# Patient Record
Sex: Male | Born: 1990 | Race: Black or African American | Hispanic: No | Smoking: Never smoker
Health system: Southern US, Community
[De-identification: ages and names within clinical notes are randomized; demographics above are authoritative.]

## PROBLEM LIST (undated history)

## (undated) DIAGNOSIS — F32A Depression, unspecified: Secondary | ICD-10-CM

## (undated) DIAGNOSIS — R569 Unspecified convulsions: Secondary | ICD-10-CM

## (undated) DIAGNOSIS — F329 Major depressive disorder, single episode, unspecified: Secondary | ICD-10-CM

## (undated) DIAGNOSIS — F419 Anxiety disorder, unspecified: Secondary | ICD-10-CM

## (undated) DIAGNOSIS — F4310 Post-traumatic stress disorder, unspecified: Secondary | ICD-10-CM

## (undated) HISTORY — PX: NOSE SURGERY: SHX723

---

## 2018-07-14 ENCOUNTER — Other Ambulatory Visit: Payer: Self-pay

## 2018-07-14 ENCOUNTER — Inpatient Hospital Stay (HOSPITAL_COMMUNITY)
Admission: EM | Admit: 2018-07-14 | Discharge: 2018-07-20 | DRG: 100 | Disposition: A | Discharge status: Home / Self Care | Payer: No Typology Code available for payment source | Attending: Internal Medicine | Admitting: Internal Medicine

## 2018-07-14 ENCOUNTER — Emergency Department (HOSPITAL_COMMUNITY): Payer: No Typology Code available for payment source

## 2018-07-14 ENCOUNTER — Encounter (HOSPITAL_COMMUNITY): Payer: Self-pay | Admitting: Family Medicine

## 2018-07-14 DIAGNOSIS — J690 Pneumonitis due to inhalation of food and vomit: Secondary | ICD-10-CM

## 2018-07-14 DIAGNOSIS — R7401 Elevation of levels of liver transaminase levels: Secondary | ICD-10-CM | POA: Diagnosis present

## 2018-07-14 DIAGNOSIS — J9601 Acute respiratory failure with hypoxia: Secondary | ICD-10-CM

## 2018-07-14 DIAGNOSIS — R740 Nonspecific elevation of levels of transaminase and lactic acid dehydrogenase [LDH]: Secondary | ICD-10-CM

## 2018-07-14 DIAGNOSIS — N289 Disorder of kidney and ureter, unspecified: Secondary | ICD-10-CM

## 2018-07-14 DIAGNOSIS — G40909 Epilepsy, unspecified, not intractable, without status epilepticus: Secondary | ICD-10-CM | POA: Diagnosis not present

## 2018-07-14 DIAGNOSIS — F418 Other specified anxiety disorders: Secondary | ICD-10-CM

## 2018-07-14 DIAGNOSIS — Z20828 Contact with and (suspected) exposure to other viral communicable diseases: Secondary | ICD-10-CM | POA: Diagnosis present

## 2018-07-14 DIAGNOSIS — E876 Hypokalemia: Secondary | ICD-10-CM

## 2018-07-14 DIAGNOSIS — Z811 Family history of alcohol abuse and dependence: Secondary | ICD-10-CM

## 2018-07-14 DIAGNOSIS — K760 Fatty (change of) liver, not elsewhere classified: Secondary | ICD-10-CM | POA: Diagnosis present

## 2018-07-14 DIAGNOSIS — F4310 Post-traumatic stress disorder, unspecified: Secondary | ICD-10-CM | POA: Diagnosis present

## 2018-07-14 DIAGNOSIS — E872 Acidosis: Secondary | ICD-10-CM | POA: Diagnosis present

## 2018-07-14 DIAGNOSIS — J189 Pneumonia, unspecified organism: Secondary | ICD-10-CM

## 2018-07-14 DIAGNOSIS — F1010 Alcohol abuse, uncomplicated: Secondary | ICD-10-CM

## 2018-07-14 DIAGNOSIS — R569 Unspecified convulsions: Secondary | ICD-10-CM

## 2018-07-14 DIAGNOSIS — R739 Hyperglycemia, unspecified: Secondary | ICD-10-CM | POA: Diagnosis present

## 2018-07-14 DIAGNOSIS — N179 Acute kidney failure, unspecified: Secondary | ICD-10-CM | POA: Diagnosis present

## 2018-07-14 HISTORY — DX: Anxiety disorder, unspecified: F41.9

## 2018-07-14 HISTORY — DX: Major depressive disorder, single episode, unspecified: F32.9

## 2018-07-14 HISTORY — DX: Unspecified convulsions: R56.9

## 2018-07-14 HISTORY — DX: Depression, unspecified: F32.A

## 2018-07-14 LAB — CBC
HCT: 48 % (ref 39.0–52.0)
Hemoglobin: 16.7 g/dL (ref 13.0–17.0)
MCH: 31.5 pg (ref 26.0–34.0)
MCHC: 34.8 g/dL (ref 30.0–36.0)
MCV: 90.6 fL (ref 80.0–100.0)
Platelets: 194 10*3/uL (ref 150–400)
RBC: 5.3 MIL/uL (ref 4.22–5.81)
RDW: 12.7 % (ref 11.5–15.5)
WBC: 11.9 10*3/uL — ABNORMAL HIGH (ref 4.0–10.5)
nRBC: 0 % (ref 0.0–0.2)

## 2018-07-14 LAB — COMPREHENSIVE METABOLIC PANEL
ALT: 598 U/L — ABNORMAL HIGH (ref 0–44)
AST: 432 U/L — ABNORMAL HIGH (ref 15–41)
Albumin: 4.3 g/dL (ref 3.5–5.0)
Alkaline Phosphatase: 49 U/L (ref 38–126)
Anion gap: 22 — ABNORMAL HIGH (ref 5–15)
BUN: 13 mg/dL (ref 6–20)
CO2: 15 mmol/L — ABNORMAL LOW (ref 22–32)
Calcium: 8.4 mg/dL — ABNORMAL LOW (ref 8.9–10.3)
Chloride: 100 mmol/L (ref 98–111)
Creatinine, Ser: 1.46 mg/dL — ABNORMAL HIGH (ref 0.61–1.24)
GFR calc Af Amer: >60 mL/min (ref >60)
GFR calc non Af Amer: >60 mL/min (ref >60)
Glucose, Bld: 218 mg/dL — ABNORMAL HIGH (ref 70–99)
Potassium: 3 mmol/L — ABNORMAL LOW (ref 3.5–5.1)
Sodium: 137 mmol/L (ref 135–145)
Total Bilirubin: 1.3 mg/dL — ABNORMAL HIGH (ref 0.3–1.2)
Total Protein: 7.3 g/dL (ref 6.5–8.1)

## 2018-07-14 LAB — URINALYSIS, ROUTINE W REFLEX MICROSCOPIC
Bilirubin Urine: NEGATIVE
Glucose, UA: 50 mg/dL — AB
Ketones, ur: 20 mg/dL — AB
Leukocytes,Ua: NEGATIVE
Nitrite: NEGATIVE
Protein, ur: 30 mg/dL — AB
Specific Gravity, Urine: 1.017 (ref 1.005–1.030)
pH: 6 (ref 5.0–8.0)

## 2018-07-14 LAB — SAMPLE TO BLOOD BANK

## 2018-07-14 LAB — SARS CORONAVIRUS 2 BY RT PCR (HOSPITAL ORDER, PERFORMED IN ~~LOC~~ HOSPITAL LAB): SARS Coronavirus 2: NEGATIVE

## 2018-07-14 LAB — CDS SEROLOGY

## 2018-07-14 LAB — ETHANOL: Alcohol, Ethyl (B): <10 mg/dL (ref <10)

## 2018-07-14 LAB — PROTIME-INR
INR: 1.1 (ref 0.8–1.2)
Prothrombin Time: 13.7 seconds (ref 11.4–15.2)

## 2018-07-14 LAB — LACTIC ACID, PLASMA: Lactic Acid, Venous: 5.5 mmol/L (ref 0.5–1.9)

## 2018-07-14 MED ORDER — EQUATE NICOTINE 4 MG MT GUM
4.00 | CHEWING_GUM | OROMUCOSAL | Status: DC
Start: ? — End: 2018-07-14

## 2018-07-14 MED ORDER — SODIUM CHLORIDE 0.9 % IV SOLN
3.0000 g | Freq: Once | INTRAVENOUS | Status: AC
  Administered 2018-07-15: 3 g via INTRAVENOUS
  Filled 2018-07-14: qty 3

## 2018-07-14 MED ORDER — LEVETIRACETAM IN NACL 1000 MG/100ML IV SOLN
1000.0000 mg | Freq: Once | INTRAVENOUS | Status: AC
  Administered 2018-07-14: 22:00:00 1000 mg via INTRAVENOUS
  Filled 2018-07-14: qty 100

## 2018-07-14 MED ORDER — CVS EAR DROPS OT
40.00 | OTIC | Status: DC
Start: 2018-07-14 — End: 2018-07-14

## 2018-07-14 MED ORDER — SODIUM CHLORIDE 0.9 % IV BOLUS
1000.0000 mL | Freq: Once | INTRAVENOUS | Status: AC
  Administered 2018-07-14: 22:00:00 1000 mL via INTRAVENOUS

## 2018-07-14 MED ORDER — CHLORDIAZEPOXIDE HCL 25 MG PO CAPS
100.0000 mg | ORAL_CAPSULE | Freq: Once | ORAL | Status: AC
  Administered 2018-07-14: 22:00:00 100 mg via ORAL
  Filled 2018-07-14: qty 4

## 2018-07-14 MED ORDER — SODIUM CHLORIDE 0.9 % IV SOLN
INTRAVENOUS | Status: DC
Start: ? — End: 2018-07-14

## 2018-07-14 MED ORDER — ACETAMINOPHEN 325 MG PO TABS
650.00 | ORAL_TABLET | ORAL | Status: DC
Start: ? — End: 2018-07-14

## 2018-07-14 MED ORDER — IOHEXOL 300 MG/ML  SOLN
100.0000 mL | Freq: Once | INTRAMUSCULAR | Status: AC | PRN
  Administered 2018-07-14: 23:00:00 100 mL via INTRAVENOUS
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
## 2018-07-14 NOTE — ED Notes (Signed)
Urine and culture sent to lab  
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
## 2018-07-15 ENCOUNTER — Encounter (HOSPITAL_COMMUNITY): Payer: Self-pay

## 2018-07-15 DIAGNOSIS — N179 Acute kidney failure, unspecified: Secondary | ICD-10-CM | POA: Diagnosis present

## 2018-07-15 DIAGNOSIS — F418 Other specified anxiety disorders: Secondary | ICD-10-CM | POA: Diagnosis present

## 2018-07-15 DIAGNOSIS — F1010 Alcohol abuse, uncomplicated: Secondary | ICD-10-CM | POA: Diagnosis present

## 2018-07-15 DIAGNOSIS — E876 Hypokalemia: Secondary | ICD-10-CM | POA: Diagnosis present

## 2018-07-15 DIAGNOSIS — G40909 Epilepsy, unspecified, not intractable, without status epilepticus: Secondary | ICD-10-CM | POA: Diagnosis present

## 2018-07-15 DIAGNOSIS — J9601 Acute respiratory failure with hypoxia: Secondary | ICD-10-CM | POA: Diagnosis present

## 2018-07-15 DIAGNOSIS — Z811 Family history of alcohol abuse and dependence: Secondary | ICD-10-CM | POA: Diagnosis not present

## 2018-07-15 DIAGNOSIS — R569 Unspecified convulsions: Secondary | ICD-10-CM | POA: Diagnosis present

## 2018-07-15 DIAGNOSIS — Z20828 Contact with and (suspected) exposure to other viral communicable diseases: Secondary | ICD-10-CM | POA: Diagnosis present

## 2018-07-15 DIAGNOSIS — N289 Disorder of kidney and ureter, unspecified: Secondary | ICD-10-CM

## 2018-07-15 DIAGNOSIS — E872 Acidosis: Secondary | ICD-10-CM | POA: Diagnosis present

## 2018-07-15 DIAGNOSIS — K760 Fatty (change of) liver, not elsewhere classified: Secondary | ICD-10-CM | POA: Diagnosis present

## 2018-07-15 DIAGNOSIS — R739 Hyperglycemia, unspecified: Secondary | ICD-10-CM | POA: Diagnosis present

## 2018-07-15 DIAGNOSIS — F4310 Post-traumatic stress disorder, unspecified: Secondary | ICD-10-CM | POA: Diagnosis present

## 2018-07-15 DIAGNOSIS — J690 Pneumonitis due to inhalation of food and vomit: Secondary | ICD-10-CM | POA: Diagnosis present

## 2018-07-15 LAB — CBC WITH DIFFERENTIAL/PLATELET
Abs Immature Granulocytes: 0.04 10*3/uL (ref 0.00–0.07)
Basophils Absolute: 0 10*3/uL (ref 0.0–0.1)
Basophils Relative: 0 %
Eosinophils Absolute: 0 10*3/uL (ref 0.0–0.5)
Eosinophils Relative: 0 %
HCT: 41.8 % (ref 39.0–52.0)
Hemoglobin: 14.7 g/dL (ref 13.0–17.0)
Immature Granulocytes: 0 %
Lymphocytes Relative: 5 %
Lymphs Abs: 0.7 10*3/uL (ref 0.7–4.0)
MCH: 31.6 pg (ref 26.0–34.0)
MCHC: 35.2 g/dL (ref 30.0–36.0)
MCV: 89.9 fL (ref 80.0–100.0)
Monocytes Absolute: 0.5 10*3/uL (ref 0.1–1.0)
Monocytes Relative: 3 %
Neutro Abs: 12.3 10*3/uL — ABNORMAL HIGH (ref 1.7–7.7)
Neutrophils Relative %: 92 %
Platelets: 142 10*3/uL — ABNORMAL LOW (ref 150–400)
RBC: 4.65 MIL/uL (ref 4.22–5.81)
RDW: 12.6 % (ref 11.5–15.5)
WBC: 13.5 10*3/uL — ABNORMAL HIGH (ref 4.0–10.5)
nRBC: 0 % (ref 0.0–0.2)

## 2018-07-15 LAB — GLUCOSE, CAPILLARY
Glucose-Capillary: 89 mg/dL (ref 70–99)
Glucose-Capillary: 80 mg/dL (ref 70–99)
Glucose-Capillary: 82 mg/dL (ref 70–99)

## 2018-07-15 LAB — LACTIC ACID, PLASMA: Lactic Acid, Venous: 1.2 mmol/L (ref 0.5–1.9)

## 2018-07-15 LAB — CREATININE, URINE, RANDOM: Creatinine, Urine: 67.12 mg/dL

## 2018-07-15 LAB — DIFFERENTIAL
Abs Immature Granulocytes: 0.03 10*3/uL (ref 0.00–0.07)
Basophils Absolute: 0 10*3/uL (ref 0.0–0.1)
Basophils Relative: 0 %
Eosinophils Absolute: 0 10*3/uL (ref 0.0–0.5)
Eosinophils Relative: 0 %
Immature Granulocytes: 0 %
Lymphocytes Relative: 3 %
Lymphs Abs: 0.4 10*3/uL — ABNORMAL LOW (ref 0.7–4.0)
Monocytes Absolute: 0.3 10*3/uL (ref 0.1–1.0)
Monocytes Relative: 3 %
Neutro Abs: 10.3 10*3/uL — ABNORMAL HIGH (ref 1.7–7.7)
Neutrophils Relative %: 94 %

## 2018-07-15 LAB — CBG MONITORING, ED
Glucose-Capillary: 88 mg/dL (ref 70–99)
Glucose-Capillary: 85 mg/dL (ref 70–99)
Glucose-Capillary: 112 mg/dL — ABNORMAL HIGH (ref 70–99)

## 2018-07-15 LAB — COMPREHENSIVE METABOLIC PANEL
ALT: 417 U/L — ABNORMAL HIGH (ref 0–44)
AST: 315 U/L — ABNORMAL HIGH (ref 15–41)
Albumin: 3.4 g/dL — ABNORMAL LOW (ref 3.5–5.0)
Alkaline Phosphatase: 33 U/L — ABNORMAL LOW (ref 38–126)
Anion gap: 8 (ref 5–15)
BUN: 8 mg/dL (ref 6–20)
CO2: 22 mmol/L (ref 22–32)
Calcium: 7.4 mg/dL — ABNORMAL LOW (ref 8.9–10.3)
Chloride: 106 mmol/L (ref 98–111)
Creatinine, Ser: 0.97 mg/dL (ref 0.61–1.24)
GFR calc Af Amer: >60 mL/min (ref >60)
GFR calc non Af Amer: >60 mL/min (ref >60)
Glucose, Bld: 99 mg/dL (ref 70–99)
Potassium: 4.1 mmol/L (ref 3.5–5.1)
Sodium: 136 mmol/L (ref 135–145)
Total Bilirubin: 1.4 mg/dL — ABNORMAL HIGH (ref 0.3–1.2)
Total Protein: 5.8 g/dL — ABNORMAL LOW (ref 6.5–8.1)

## 2018-07-15 LAB — HEMOGLOBIN A1C
Hgb A1c MFr Bld: 5 % (ref 4.8–5.6)
Mean Plasma Glucose: 96.8 mg/dL

## 2018-07-15 LAB — STREP PNEUMONIAE URINARY ANTIGEN: Strep Pneumo Urinary Antigen: NEGATIVE

## 2018-07-15 LAB — RAPID URINE DRUG SCREEN, HOSP PERFORMED
Amphetamines: NOT DETECTED
Barbiturates: NOT DETECTED
Benzodiazepines: POSITIVE — AB
Cocaine: NOT DETECTED
Opiates: NOT DETECTED
Tetrahydrocannabinol: NOT DETECTED

## 2018-07-15 LAB — PROCALCITONIN
Procalcitonin: 4.02 ng/mL
Procalcitonin: 5.65 ng/mL

## 2018-07-15 LAB — HIV ANTIBODY (ROUTINE TESTING W REFLEX): HIV Screen 4th Generation wRfx: NONREACTIVE

## 2018-07-15 LAB — SALICYLATE LEVEL: Salicylate Lvl: <7 mg/dL (ref 2.8–30.0)

## 2018-07-15 LAB — SODIUM, URINE, RANDOM: Sodium, Ur: 100 mmol/L

## 2018-07-15 LAB — ACETAMINOPHEN LEVEL: Acetaminophen (Tylenol), Serum: <10 ug/mL — ABNORMAL LOW (ref 10–30)

## 2018-07-15 LAB — MAGNESIUM: Magnesium: 1.8 mg/dL (ref 1.7–2.4)

## 2018-07-15 LAB — MRSA PCR SCREENING: MRSA by PCR: NEGATIVE

## 2018-07-15 MED ORDER — ENOXAPARIN SODIUM 40 MG/0.4ML ~~LOC~~ SOLN
40.0000 mg | SUBCUTANEOUS | Status: DC
  Administered 2018-07-15 – 2018-07-19 (×5): 40 mg via SUBCUTANEOUS
  Filled 2018-07-15 (×5): qty 0.4

## 2018-07-15 MED ORDER — VITAMIN B-1 100 MG PO TABS
100.0000 mg | ORAL_TABLET | Freq: Every day | ORAL | Status: DC
  Administered 2018-07-16 – 2018-07-20 (×5): 100 mg via ORAL
  Filled 2018-07-15 (×5): qty 1

## 2018-07-15 MED ORDER — FOLIC ACID 1 MG PO TABS
1.0000 mg | ORAL_TABLET | Freq: Every day | ORAL | Status: DC
  Administered 2018-07-16 – 2018-07-20 (×5): 1 mg via ORAL
  Filled 2018-07-15 (×5): qty 1

## 2018-07-15 MED ORDER — ACETAMINOPHEN 325 MG PO TABS
650.0000 mg | ORAL_TABLET | Freq: Four times a day (QID) | ORAL | Status: DC | PRN
  Administered 2018-07-15: 10:00:00 650 mg via ORAL
  Filled 2018-07-15: qty 2

## 2018-07-15 MED ORDER — POLYETHYLENE GLYCOL 3350 17 G PO PACK: 17.0000 g | PACK | Freq: Every day | ORAL | Status: DC | PRN

## 2018-07-15 MED ORDER — ADULT MULTIVITAMIN W/MINERALS CH
1.0000 | ORAL_TABLET | Freq: Every day | ORAL | Status: DC
  Administered 2018-07-15 – 2018-07-20 (×6): 1 via ORAL
  Filled 2018-07-15 (×6): qty 1

## 2018-07-15 MED ORDER — HYDROCODONE-ACETAMINOPHEN 5-325 MG PO TABS
1.0000 | ORAL_TABLET | ORAL | Status: DC | PRN
  Administered 2018-07-15 – 2018-07-20 (×3): 1 via ORAL
  Filled 2018-07-15 (×3): qty 1

## 2018-07-15 MED ORDER — SODIUM CHLORIDE 0.9 % IV SOLN
3.0000 g | Freq: Four times a day (QID) | INTRAVENOUS | Status: DC
  Administered 2018-07-15 – 2018-07-17 (×8): 3 g via INTRAVENOUS
  Filled 2018-07-15 (×12): qty 3

## 2018-07-15 MED ORDER — FOLIC ACID 5 MG/ML IJ SOLN
1.0000 mg | Freq: Every day | INTRAMUSCULAR | Status: DC
  Administered 2018-07-15: 1 mg via INTRAVENOUS
  Filled 2018-07-15: qty 0.2

## 2018-07-15 MED ORDER — INSULIN ASPART 100 UNIT/ML ~~LOC~~ SOLN: 0.0000 [IU] | Freq: Three times a day (TID) | SUBCUTANEOUS | Status: DC

## 2018-07-15 MED ORDER — LORAZEPAM 2 MG/ML IJ SOLN
2.0000 mg | INTRAMUSCULAR | Status: DC | PRN
  Administered 2018-07-17 – 2018-07-19 (×7): 2 mg via INTRAVENOUS
  Filled 2018-07-15 (×2): qty 1
  Filled 2018-07-15: qty 2
  Filled 2018-07-15: qty 1
  Filled 2018-07-15: qty 2
  Filled 2018-07-15 (×2): qty 1
  Filled 2018-07-15: qty 2
  Filled 2018-07-15: qty 1

## 2018-07-15 MED ORDER — INSULIN ASPART 100 UNIT/ML ~~LOC~~ SOLN: 0.0000 [IU] | Freq: Every day | SUBCUTANEOUS | Status: DC

## 2018-07-15 MED ORDER — SODIUM CHLORIDE 0.9 % IV SOLN
INTRAVENOUS | Status: DC
  Administered 2018-07-15 – 2018-07-17 (×5): via INTRAVENOUS

## 2018-07-15 MED ORDER — ENOXAPARIN SODIUM 40 MG/0.4ML ~~LOC~~ SOLN: 40.0000 mg | SUBCUTANEOUS | Status: DC

## 2018-07-15 MED ORDER — ACETAMINOPHEN 650 MG RE SUPP: 650.0000 mg | Freq: Four times a day (QID) | RECTAL | Status: DC | PRN

## 2018-07-15 MED ORDER — LEVETIRACETAM 500 MG PO TABS
500.0000 mg | ORAL_TABLET | Freq: Two times a day (BID) | ORAL | Status: DC
  Administered 2018-07-15 – 2018-07-20 (×11): 500 mg via ORAL
  Filled 2018-07-15 (×11): qty 1

## 2018-07-15 MED ORDER — POTASSIUM CHLORIDE CRYS ER 20 MEQ PO TBCR
20.0000 meq | EXTENDED_RELEASE_TABLET | Freq: Once | ORAL | Status: AC
  Administered 2018-07-15: 20 meq via ORAL
  Filled 2018-07-15: qty 1

## 2018-07-15 MED ORDER — MAGIC MOUTHWASH W/LIDOCAINE
15.0000 mL | Freq: Four times a day (QID) | ORAL | Status: DC | PRN
  Administered 2018-07-15 – 2018-07-16 (×2): 15 mL via ORAL
  Filled 2018-07-15 (×5): qty 15

## 2018-07-15 MED ORDER — POTASSIUM CHLORIDE IN NACL 20-0.9 MEQ/L-% IV SOLN
INTRAVENOUS | Status: AC
  Administered 2018-07-15 (×2): via INTRAVENOUS
  Filled 2018-07-15 (×2): qty 1000

## 2018-07-15 MED ORDER — SODIUM CHLORIDE 0.9% FLUSH
3.0000 mL | Freq: Two times a day (BID) | INTRAVENOUS | Status: DC
  Administered 2018-07-15 – 2018-07-19 (×8): 3 mL via INTRAVENOUS

## 2018-07-15 MED ORDER — ONDANSETRON HCL 4 MG/2ML IJ SOLN
4.0000 mg | Freq: Four times a day (QID) | INTRAMUSCULAR | Status: DC | PRN
  Administered 2018-07-16 – 2018-07-17 (×2): 4 mg via INTRAVENOUS
  Filled 2018-07-15 (×2): qty 2

## 2018-07-15 MED ORDER — ONDANSETRON HCL 4 MG PO TABS: 4.0000 mg | ORAL_TABLET | Freq: Four times a day (QID) | ORAL | Status: DC | PRN

## 2018-07-15 MED ORDER — THIAMINE HCL 100 MG/ML IJ SOLN
100.0000 mg | Freq: Every day | INTRAMUSCULAR | Status: DC
  Administered 2018-07-15: 10:00:00 100 mg via INTRAVENOUS
  Filled 2018-07-15: qty 2

## 2018-07-15 NOTE — ED Notes (Signed)
Patient complaining of 8/10 aching chest pain. Pain is made worse with movement and to palpation. EKG completed and ST noted on monitor. VSS.  MD Dayna Barker text paged regarding patient chest pain. MD to see patient shortly. PRN Tylenol ordered for chest pain, possibly secondary to CPR on patient yesterday.

## 2018-07-15 NOTE — Progress Notes (Signed)
Chart reviewed and scheduled admission, defer to IP [facility] CM/CSW to assist with dc needs. Isidoro Donning RN CCM Case Mgmt phone [phone]
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
## 2018-07-15 NOTE — ED Notes (Signed)
Bed: WA16 Expected date:  Expected time:  Means of arrival:  Comments: Res A 

## 2018-07-15 NOTE — ED Notes (Signed)
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 hematoma. Adrenals/Urinary Tract: No adrenal hemorrhage or renal injury identified. Bladder is unremarkable. Stomach/Bowel: Moderate  fluid within the stomach. Stomach, large and small bowel grossly unremarkable. Appendix is normal. Vascular/Lymphatic: No evidence of aneurysm or adenopathy. Reproductive: No visible focal abnormality. Other: No free fluid or free air. Musculoskeletal: No acute bony abnormality. IMPRESSION: Extensive airspace opacities dependently in both lower lobes and the posterior right upper lobe. Given a fluid-filled esophagus, this could reflect aspiration. Fatty infiltration of the liver. No evidence of solid organ injury. Electronically Signed   By: Charlett Nose M.D.   On: 07/14/2018 23:02   Dg Chest Port 1 View  Result Date: 07/14/2018 CLINICAL DATA:  Hypoxia EXAM: PORTABLE CHEST 1 VIEW COMPARISON:  None. FINDINGS: The heart size and mediastinal contours are within normal limits. Both lungs are clear. The visualized skeletal structures are unremarkable. IMPRESSION: No active disease. Electronically Signed   By: Alcide Clever M.D.   On: 07/14/2018 21:12    Pending Labs Unresulted Labs (From admission, onward)    Start     Ordered   07/15/18 0500  HIV antibody (Routine Testing)  Tomorrow morning,   R     07/15/18 0000   07/15/18 0500  Procalcitonin  Daily,   R     07/15/18 0000   07/15/18 0500  Hepatitis panel, acute  Tomorrow morning,   R     07/15/18 0019   07/14/18 2357  Culture, sputum-assessment  Once,   R     07/15/18 0000   07/14/18 2357  Gram stain  Once,   R     07/15/18 0000          Vitals/Pain Today's Vitals   07/15/18 1000 07/15/18 1112 07/15/18 1200 07/15/18 1300  BP: (!) 120/54 102/62 115/65 126/67  Pulse: 100 99 94 95  Resp: 19 16 18  (!) 29  Temp:      TempSrc:      SpO2: 97% 97% 97% 99%  PainSc:        Isolation Precautions No active isolations  Medications Medications  sodium chloride flush (NS) 0.9 % injection 3 mL (3 mLs Intravenous Given 07/15/18 0933)  0.9 % NaCl with KCl 20 mEq/  L  infusion ( Intravenous Stopped 07/15/18 1242)  acetaminophen (TYLENOL) tablet 650 mg  (650 mg Oral Given 07/15/18 0932)    Or  acetaminophen (TYLENOL) suppository 650 mg ( Rectal See Alternative 07/15/18 0932)  HYDROcodone-acetaminophen (NORCO/VICODIN) 5-325 MG per tablet 1 tablet (has no administration in time range)  polyethylene glycol (MIRALAX / GLYCOLAX) packet 17 g (has no administration in time range)  ondansetron (ZOFRAN) tablet 4 mg (has no administration in time range)    Or  ondansetron (ZOFRAN) injection 4 mg (has no administration in time range)  LORazepam (ATIVAN) injection 2-3 mg (has no administration in time range)  multivitamin with minerals tablet 1 tablet (1 tablet Oral Given 07/15/18 0932)  levETIRAcetam (KEPPRA) tablet 500 mg (500 mg Oral Given 07/15/18 0932)  Ampicillin-Sulbactam (UNASYN) 3 g in sodium chloride 0.9 % 100 mL IVPB (0 g Intravenous Stopped 07/15/18 0724)  insulin aspart (novoLOG) injection 0-9 Units (0 Units Subcutaneous Not Given 07/15/18 1252)  insulin aspart (novoLOG) injection 0-5 Units (0 Units Subcutaneous Not Given 07/15/18 0116)  thiamine (VITAMIN B-1) tablet 100 mg (has no administration in time range)  folic acid (FOLVITE) tablet 1 mg (has no administration in time range)  sodium chloride 0.9 % bolus 1,000 mL (0 mLs Intravenous Stopped 07/14/18 2303)  sodium chloride 0.9 % bolus 1,000 mL (0 mLs Intravenous Stopped 07/14/18 2303)  levETIRAcetam (KEPPRA) IVPB 1000 mg/100 mL premix (0 mg Intravenous Stopped 07/14/18 2303)  chlordiazePOXIDE (LIBRIUM) capsule 100 mg (100 mg Oral Given 07/14/18 2222)  iohexol (OMNIPAQUE) 300 MG/ML solution 100 mL (100 mLs Intravenous Contrast Given 07/14/18 2237)  Ampicillin-Sulbactam (UNASYN) 3 g in sodium chloride 0.9 % 100 mL IVPB (0 g Intravenous Stopped 07/15/18 0054)  potassium chloride SA (K-DUR) CR tablet 20 mEq (20 mEq Oral Given 07/15/18 0027)    Mobility walks with person assist

## 2018-07-15 NOTE — ED Notes (Signed)
Seizure pads were placed on pt. Bed due to pt. Having a Hx of seizures. Nurse aware.

## 2018-07-15 NOTE — Progress Notes (Signed)
Pharmacy Antibiotic Note  Jim Duke is a 28 y.o. male admitted on 07/14/2018 with bacteremia.  Pharmacy has been consulted for Unasyn dosing.  Plan: Unasyn 3gm iv q6hr     Temp (24hrs), Avg:97.9 F (36.6 C), Min:97.9 F (36.6 C), Max:97.9 F (36.6 C)  Recent Labs  Lab 07/14/18 2102  WBC 11.9*  CREATININE 1.46*  LATICACIDVEN 5.5*    CrCl cannot be calculated (Unknown ideal weight.).    No Known Allergies  Antimicrobials this admission: Unasyn 07/15/2018 >>  Dose adjustments this admission: -  Microbiology results: -  Thank you for allowing pharmacy to be a part of this patient's care.  Aleene Davidson Crowford 07/15/2018 12:17 AM

## 2018-07-16 LAB — BASIC METABOLIC PANEL
Anion gap: 9 (ref 5–15)
BUN: 6 mg/dL (ref 6–20)
CO2: 23 mmol/L (ref 22–32)
Calcium: 8.2 mg/dL — ABNORMAL LOW (ref 8.9–10.3)
Chloride: 106 mmol/L (ref 98–111)
Creatinine, Ser: 0.83 mg/dL (ref 0.61–1.24)
GFR calc Af Amer: >60 mL/min (ref >60)
GFR calc non Af Amer: >60 mL/min (ref >60)
Glucose, Bld: 85 mg/dL (ref 70–99)
Potassium: 3.4 mmol/L — ABNORMAL LOW (ref 3.5–5.1)
Sodium: 138 mmol/L (ref 135–145)

## 2018-07-16 LAB — CBC
HCT: 42.9 % (ref 39.0–52.0)
Hemoglobin: 14.6 g/dL (ref 13.0–17.0)
MCH: 31.3 pg (ref 26.0–34.0)
MCHC: 34 g/dL (ref 30.0–36.0)
MCV: 91.9 fL (ref 80.0–100.0)
Platelets: 124 10*3/uL — ABNORMAL LOW (ref 150–400)
RBC: 4.67 MIL/uL (ref 4.22–5.81)
RDW: 13.1 % (ref 11.5–15.5)
WBC: 12.4 10*3/uL — ABNORMAL HIGH (ref 4.0–10.5)
nRBC: 0 % (ref 0.0–0.2)

## 2018-07-16 LAB — PROCALCITONIN: Procalcitonin: 4.31 ng/mL

## 2018-07-16 LAB — GLUCOSE, CAPILLARY
Glucose-Capillary: 67 mg/dL — ABNORMAL LOW (ref 70–99)
Glucose-Capillary: 90 mg/dL (ref 70–99)
Glucose-Capillary: 81 mg/dL (ref 70–99)
Glucose-Capillary: 79 mg/dL (ref 70–99)
Glucose-Capillary: 81 mg/dL (ref 70–99)

## 2018-07-16 LAB — HEPATITIS PANEL, ACUTE
HCV Ab: 0.3 s/co ratio (ref 0.0–0.9)
Hep A IgM: NEGATIVE
Hep B C IgM: NEGATIVE
Hepatitis B Surface Ag: NEGATIVE

## 2018-07-16 MED ORDER — ALUM & MAG HYDROXIDE-SIMETH 200-200-20 MG/5ML PO SUSP: 30.0000 mL | ORAL | Status: DC | PRN

## 2018-07-16 MED ORDER — POTASSIUM CHLORIDE CRYS ER 20 MEQ PO TBCR
20.0000 meq | EXTENDED_RELEASE_TABLET | Freq: Once | ORAL | Status: AC
  Administered 2018-07-16: 09:00:00 20 meq via ORAL
  Filled 2018-07-16: qty 1

## 2018-07-16 MED ORDER — IBUPROFEN 200 MG PO TABS: 400.0000 mg | ORAL_TABLET | Freq: Four times a day (QID) | ORAL | Status: DC | PRN

## 2018-07-16 MED ORDER — PANTOPRAZOLE SODIUM 40 MG PO TBEC
40.0000 mg | DELAYED_RELEASE_TABLET | Freq: Every day | ORAL | Status: DC
  Administered 2018-07-16 – 2018-07-20 (×5): 40 mg via ORAL
  Filled 2018-07-16 (×5): qty 1
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
## 2018-07-16 NOTE — Progress Notes (Signed)
Called report to [facility] on 4E. Informed patient on transfer and new room number. Patient stated he would update family. All belongings sent with patient.

## 2018-07-16 NOTE — Progress Notes (Signed)
Pt called and is complaining of increased SOB and a pressure in his chest. 12 lead EKG obtained and Dr. Dayna Barker made aware. Will continue to monitor.

## 2018-07-17 ENCOUNTER — Inpatient Hospital Stay (HOSPITAL_COMMUNITY): Payer: No Typology Code available for payment source

## 2018-07-17 LAB — CBC
HCT: 42.7 % (ref 39.0–52.0)
Hemoglobin: 14.7 g/dL (ref 13.0–17.0)
MCH: 30.9 pg (ref 26.0–34.0)
MCHC: 34.4 g/dL (ref 30.0–36.0)
MCV: 89.9 fL (ref 80.0–100.0)
Platelets: 155 10*3/uL (ref 150–400)
RBC: 4.75 MIL/uL (ref 4.22–5.81)
RDW: 12.4 % (ref 11.5–15.5)
WBC: 8.7 10*3/uL (ref 4.0–10.5)
nRBC: 0 % (ref 0.0–0.2)

## 2018-07-17 LAB — COMPREHENSIVE METABOLIC PANEL
ALT: 174 U/L — ABNORMAL HIGH (ref 0–44)
AST: 45 U/L — ABNORMAL HIGH (ref 15–41)
Albumin: 3.3 g/dL — ABNORMAL LOW (ref 3.5–5.0)
Alkaline Phosphatase: 41 U/L (ref 38–126)
Anion gap: 14 (ref 5–15)
BUN: 6 mg/dL (ref 6–20)
CO2: 21 mmol/L — ABNORMAL LOW (ref 22–32)
Calcium: 8.5 mg/dL — ABNORMAL LOW (ref 8.9–10.3)
Chloride: 105 mmol/L (ref 98–111)
Creatinine, Ser: 0.9 mg/dL (ref 0.61–1.24)
GFR calc Af Amer: >60 mL/min (ref >60)
GFR calc non Af Amer: >60 mL/min (ref >60)
Glucose, Bld: 79 mg/dL (ref 70–99)
Potassium: 3.7 mmol/L (ref 3.5–5.1)
Sodium: 140 mmol/L (ref 135–145)
Total Bilirubin: 1.5 mg/dL — ABNORMAL HIGH (ref 0.3–1.2)
Total Protein: 6.4 g/dL — ABNORMAL LOW (ref 6.5–8.1)

## 2018-07-17 LAB — GLUCOSE, CAPILLARY
Glucose-Capillary: 76 mg/dL (ref 70–99)
Glucose-Capillary: 78 mg/dL (ref 70–99)
Glucose-Capillary: 79 mg/dL (ref 70–99)
Glucose-Capillary: 78 mg/dL (ref 70–99)

## 2018-07-17 MED ORDER — PIPERACILLIN-TAZOBACTAM 3.375 G IVPB
3.3750 g | Freq: Three times a day (TID) | INTRAVENOUS | Status: DC
  Administered 2018-07-17 – 2018-07-20 (×9): 3.375 g via INTRAVENOUS
  Filled 2018-07-17 (×9): qty 50

## 2018-07-17 MED ORDER — AMOXICILLIN-POT CLAVULANATE 875-125 MG PO TABS: 1.0000 | ORAL_TABLET | Freq: Two times a day (BID) | ORAL | 0 refills | Status: DC

## 2018-07-17 MED ORDER — LEVETIRACETAM 500 MG PO TABS: 500.0000 mg | ORAL_TABLET | Freq: Two times a day (BID) | ORAL | 0 refills | Status: DC

## 2018-07-17 NOTE — Progress Notes (Signed)
SATURATION QUALIFICATIONS: (This note is used to comply with regulatory documentation for home oxygen)  Patient Saturations on Room Air at Rest = 97%  Patient Saturations on Room Air while Ambulating = 94%  Patient Saturations on 0 Liters of oxygen while Ambulating = 94%  Please briefly explain why patient needs home oxygen: 
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
## 2018-07-17 NOTE — Progress Notes (Signed)
NT attempted to ambulate patient and obtain O2 saturation. Pt stood at bedside with an O2 saturation of 95% on room air. Shortly after standing the pt verbalized they were nauseous and light-headed and had to sit back down, pt then vomited 100 mL of clear emesis. Pt then received Zofran and no further nausea has been reported. Will try to ambulate pt again before end of shift.

## 2018-07-17 NOTE — Progress Notes (Signed)
Pharmacy Antibiotic Note  Jim Duke is a 28 y.o. male admitted on 07/14/2018 with aspiration PNA.  Pharmacy has been consulted for piperacillin/tazobactam dosing. NKDA.  Patient currently on D #3 IV antibiotics on ampicillin/sulbactam for aspiration PNA. MD would like to broaden coverage to piperacillin/tazobactam - pharmacy to dose.   Today, 07/17/18  WBC 8.7 - WNL  SCr 0.9, CrCl >100 mL/min  Afebrile  Plan:  Discontinue ampicillin/sulbactam  Initiate piperacillin/tazobactam 3.375 g IV q8h EI  Height: 5\' 8"  (172.7 cm) Weight: 197 lb 12 oz (89.7 kg) IBW/kg (Calculated) : 68.4  Temp (24hrs), Avg:98.7 F (37.1 C), Min:98.3 F (36.8 C), Max:99.1 F (37.3 C)  Recent Labs  Lab 07/14/18 2102 07/15/18 0002 07/15/18 0434 07/16/18 0242 07/17/18 0530  WBC 11.9*  --  13.5* 12.4* 8.7  CREATININE 1.46*  --  0.97 0.83 0.90  LATICACIDVEN 5.5* 1.2  --   --   --     Estimated Creatinine Clearance: 130.5 mL/min (by C-G formula based on SCr of 0.9 mg/dL).    No Known Allergies  Antimicrobials this admission: Piperacillin/tazobactam 5/9 >>  Ampicillin/sulbactam 5/7 >> 5/6  Dose adjustments this admission:  Microbiology results: 5/7 Sputum: Sent  5/7 MRSA PCR: Negative 5/9 COVID-19: Negative  Thank you for allowing pharmacy to be a part of this patient's care.  Cindi Carbon, PharmD 07/17/2018 12:00 PM

## 2018-07-18 LAB — COMPREHENSIVE METABOLIC PANEL
ALT: 128 U/L — ABNORMAL HIGH (ref 0–44)
AST: 33 U/L (ref 15–41)
Albumin: 3.5 g/dL (ref 3.5–5.0)
Alkaline Phosphatase: 42 U/L (ref 38–126)
Anion gap: 11 (ref 5–15)
BUN: 7 mg/dL (ref 6–20)
CO2: 23 mmol/L (ref 22–32)
Calcium: 8.6 mg/dL — ABNORMAL LOW (ref 8.9–10.3)
Chloride: 106 mmol/L (ref 98–111)
Creatinine, Ser: 0.98 mg/dL (ref 0.61–1.24)
GFR calc Af Amer: >60 mL/min (ref >60)
GFR calc non Af Amer: >60 mL/min (ref >60)
Glucose, Bld: 86 mg/dL (ref 70–99)
Potassium: 3.4 mmol/L — ABNORMAL LOW (ref 3.5–5.1)
Sodium: 140 mmol/L (ref 135–145)
Total Bilirubin: 1.4 mg/dL — ABNORMAL HIGH (ref 0.3–1.2)
Total Protein: 6.5 g/dL (ref 6.5–8.1)

## 2018-07-18 LAB — CBC
HCT: 43.8 % (ref 39.0–52.0)
Hemoglobin: 15.3 g/dL (ref 13.0–17.0)
MCH: 31.2 pg (ref 26.0–34.0)
MCHC: 34.9 g/dL (ref 30.0–36.0)
MCV: 89.4 fL (ref 80.0–100.0)
Platelets: 185 10*3/uL (ref 150–400)
RBC: 4.9 MIL/uL (ref 4.22–5.81)
RDW: 12.1 % (ref 11.5–15.5)
WBC: 7.6 10*3/uL (ref 4.0–10.5)
nRBC: 0 % (ref 0.0–0.2)

## 2018-07-18 LAB — GLUCOSE, CAPILLARY
Glucose-Capillary: 93 mg/dL (ref 70–99)
Glucose-Capillary: 107 mg/dL — ABNORMAL HIGH (ref 70–99)
Glucose-Capillary: 88 mg/dL (ref 70–99)
Glucose-Capillary: 84 mg/dL (ref 70–99)

## 2018-07-18 MED ORDER — SODIUM CHLORIDE 0.9 % IV SOLN
INTRAVENOUS | Status: DC | PRN
  Administered 2018-07-18: 13:00:00 250 mL via INTRAVENOUS

## 2018-07-18 MED ORDER — POTASSIUM CHLORIDE CRYS ER 20 MEQ PO TBCR
20.0000 meq | EXTENDED_RELEASE_TABLET | Freq: Once | ORAL | Status: AC
  Administered 2018-07-18: 20 meq via ORAL
  Filled 2018-07-18: qty 1

## 2018-07-18 MED ORDER — LORAZEPAM 2 MG/ML IJ SOLN
1.0000 mg | Freq: Once | INTRAMUSCULAR | Status: AC
  Administered 2018-07-18: 02:00:00 1 mg via INTRAVENOUS
  Filled 2018-07-18: qty 1
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
## 2018-07-19 ENCOUNTER — Inpatient Hospital Stay (HOSPITAL_COMMUNITY): Payer: No Typology Code available for payment source

## 2018-07-19 LAB — GLUCOSE, CAPILLARY
Glucose-Capillary: 81 mg/dL (ref 70–99)
Glucose-Capillary: 79 mg/dL (ref 70–99)
Glucose-Capillary: 81 mg/dL (ref 70–99)
Glucose-Capillary: 74 mg/dL (ref 70–99)

## 2018-07-19 MED ORDER — IBUPROFEN 200 MG PO TABS: 400.0000 mg | ORAL_TABLET | Freq: Four times a day (QID) | ORAL | Status: DC | PRN

## 2018-07-19 MED ORDER — ZOLPIDEM TARTRATE 10 MG PO TABS
10.0000 mg | ORAL_TABLET | Freq: Every evening | ORAL | Status: DC | PRN
  Administered 2018-07-19: 10 mg via ORAL
  Filled 2018-07-19: qty 1
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
## 2018-07-20 LAB — GLUCOSE, CAPILLARY: Glucose-Capillary: 102 mg/dL — ABNORMAL HIGH (ref 70–99)

## 2018-07-20 MED ORDER — AMOXICILLIN-POT CLAVULANATE 875-125 MG PO TABS: 1.0000 | ORAL_TABLET | Freq: Two times a day (BID) | ORAL | 0 refills | Status: AC

## 2018-07-20 MED ORDER — LORAZEPAM 2 MG/ML IJ SOLN
3.0000 mg | Freq: Once | INTRAMUSCULAR | Status: AC
  Administered 2018-07-20: 3 mg via INTRAVENOUS

## 2018-07-20 NOTE — Progress Notes (Signed)
Pt discharged home today per Dr. Lanae Boast. Pt's IV site D/C'd and WDL. Pt's VSS. Pt provided with home medication list, discharge instructions and prescriptions. Verbalized understanding. Pt left floor via WC in stable condition accompanied by NT.

## 2018-07-20 NOTE — Discharge Summary (Signed)
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  [facility] 07/20/2018, 11:32 AM  If 7PM-7AM, please contact night-coverage [email or web address]

## 2018-07-27 LAB — HCV RNA QUANT: HCV Quantitative: NOT DETECTED IU/mL (ref >50)

## 2019-01-25 ENCOUNTER — Other Ambulatory Visit: Payer: Self-pay

## 2019-01-25 ENCOUNTER — Encounter (HOSPITAL_COMMUNITY): Payer: Self-pay | Admitting: *Deleted

## 2019-01-25 ENCOUNTER — Emergency Department (HOSPITAL_COMMUNITY)
Admission: EM | Admit: 2019-01-25 | Discharge: 2019-01-25 | Disposition: A | Discharge status: Home / Self Care | Payer: Self-pay | Attending: Emergency Medicine | Admitting: Emergency Medicine

## 2019-01-25 DIAGNOSIS — M25512 Pain in left shoulder: Secondary | ICD-10-CM | POA: Insufficient documentation

## 2019-01-25 MED ORDER — CELECOXIB 200 MG PO CAPS: 200.0000 mg | ORAL_CAPSULE | Freq: Two times a day (BID) | ORAL | 0 refills | Status: DC

## 2019-01-25 MED ORDER — HYDROCODONE-ACETAMINOPHEN 5-325 MG PO TABS: 1.0000 | ORAL_TABLET | Freq: Four times a day (QID) | ORAL | 0 refills | Status: DC | PRN

## 2019-01-25 MED ORDER — BACLOFEN 10 MG PO TABS: 10.0000 mg | ORAL_TABLET | Freq: Three times a day (TID) | ORAL | 0 refills | Status: DC

## 2019-01-25 NOTE — Discharge Instructions (Signed)
Return to the ER if you pass out, become severely short of breath. Cough up blood or have severe chest pain

## 2019-01-25 NOTE — ED Triage Notes (Signed)
States he was lifting at work and now has pain in left scapula area
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
## 2019-01-26 MED FILL — Hydrocodone-Acetaminophen Tab 5-325 MG: ORAL | Qty: 6 | Status: AC

## 2019-03-07 ENCOUNTER — Ambulatory Visit: Payer: HRSA Program | Attending: Internal Medicine

## 2019-03-07 ENCOUNTER — Other Ambulatory Visit: Payer: Self-pay

## 2019-03-07 DIAGNOSIS — Z20822 Contact with and (suspected) exposure to covid-19: Secondary | ICD-10-CM

## 2019-03-07 DIAGNOSIS — Z20828 Contact with and (suspected) exposure to other viral communicable diseases: Secondary | ICD-10-CM | POA: Diagnosis not present

## 2019-03-09 LAB — NOVEL CORONAVIRUS, NAA: SARS-CoV-2, NAA: NOT DETECTED

## 2020-12-04 ENCOUNTER — Emergency Department (HOSPITAL_COMMUNITY)
Admission: EM | Admit: 2020-12-04 | Discharge: 2020-12-06 | Disposition: A | Discharge status: Other Acute Inpatient Hospital | Payer: No Typology Code available for payment source | Attending: Emergency Medicine | Admitting: Emergency Medicine

## 2020-12-04 ENCOUNTER — Other Ambulatory Visit: Payer: Self-pay

## 2020-12-04 ENCOUNTER — Encounter (HOSPITAL_COMMUNITY): Payer: Self-pay

## 2020-12-04 DIAGNOSIS — R000 Tachycardia, unspecified: Secondary | ICD-10-CM | POA: Diagnosis not present

## 2020-12-04 DIAGNOSIS — T4272XA Poisoning by unspecified antiepileptic and sedative-hypnotic drugs, intentional self-harm, initial encounter: Secondary | ICD-10-CM | POA: Insufficient documentation

## 2020-12-04 DIAGNOSIS — F332 Major depressive disorder, recurrent severe without psychotic features: Secondary | ICD-10-CM | POA: Diagnosis not present

## 2020-12-04 DIAGNOSIS — Y905 Blood alcohol level of 100-119 mg/100 ml: Secondary | ICD-10-CM | POA: Insufficient documentation

## 2020-12-04 DIAGNOSIS — F322 Major depressive disorder, single episode, severe without psychotic features: Secondary | ICD-10-CM | POA: Diagnosis not present

## 2020-12-04 DIAGNOSIS — R45851 Suicidal ideations: Secondary | ICD-10-CM

## 2020-12-04 DIAGNOSIS — Z20822 Contact with and (suspected) exposure to covid-19: Secondary | ICD-10-CM | POA: Insufficient documentation

## 2020-12-04 HISTORY — DX: Post-traumatic stress disorder, unspecified: F43.10

## 2020-12-04 LAB — CBC WITH DIFFERENTIAL/PLATELET
Abs Immature Granulocytes: 0.02 10*3/uL (ref 0.00–0.07)
Basophils Absolute: 0 10*3/uL (ref 0.0–0.1)
Basophils Relative: 1 %
Eosinophils Absolute: 0.1 10*3/uL (ref 0.0–0.5)
Eosinophils Relative: 2 %
HCT: 50.2 % (ref 39.0–52.0)
Hemoglobin: 17.6 g/dL — ABNORMAL HIGH (ref 13.0–17.0)
Immature Granulocytes: 0 %
Lymphocytes Relative: 30 %
Lymphs Abs: 1.9 10*3/uL (ref 0.7–4.0)
MCH: 29.3 pg (ref 26.0–34.0)
MCHC: 35.1 g/dL (ref 30.0–36.0)
MCV: 83.5 fL (ref 80.0–100.0)
Monocytes Absolute: 0.8 10*3/uL (ref 0.1–1.0)
Monocytes Relative: 12 %
Neutro Abs: 3.5 10*3/uL (ref 1.7–7.7)
Neutrophils Relative %: 55 %
Platelets: 231 10*3/uL (ref 150–400)
RBC: 6.01 MIL/uL — ABNORMAL HIGH (ref 4.22–5.81)
RDW: 17 % — ABNORMAL HIGH (ref 11.5–15.5)
WBC: 6.4 10*3/uL (ref 4.0–10.5)
nRBC: 0 % (ref 0.0–0.2)

## 2020-12-04 MED ORDER — LORAZEPAM 2 MG/ML IJ SOLN
2.0000 mg | Freq: Once | INTRAMUSCULAR | Status: AC
  Administered 2020-12-04: 2 mg via INTRAVENOUS
  Filled 2020-12-04: qty 1

## 2020-12-04 MED ORDER — LORAZEPAM 2 MG/ML IJ SOLN
1.0000 mg | Freq: Once | INTRAMUSCULAR | Status: AC
  Administered 2020-12-04: 1 mg via INTRAVENOUS
  Filled 2020-12-04: qty 1

## 2020-12-04 MED ORDER — SODIUM CHLORIDE 0.9 % IV BOLUS
1000.0000 mL | Freq: Once | INTRAVENOUS | Status: AC
  Administered 2020-12-04: 1000 mL via INTRAVENOUS

## 2020-12-04 MED ORDER — HALOPERIDOL LACTATE 5 MG/ML IJ SOLN
5.0000 mg | Freq: Once | INTRAMUSCULAR | Status: AC
  Administered 2020-12-04: 5 mg via INTRAMUSCULAR
  Filled 2020-12-04: qty 1

## 2020-12-04 NOTE — ED Triage Notes (Addendum)
Pt brought here by [facility] from home for suicide attempt- pt says he took ten 10 mg ambien and ten 1 mg ativan in attempt to kill himself. Pt then says he had second thoughts and called EMS. Pt says he took these pills around 7pm.
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
## 2020-12-04 NOTE — ED Notes (Signed)
Poison control called - given instructions to continue monitoring for respiratory depression, 6 hr obs, and Tylenol in 4 hrs

## 2020-12-05 DIAGNOSIS — F322 Major depressive disorder, single episode, severe without psychotic features: Secondary | ICD-10-CM | POA: Insufficient documentation

## 2020-12-05 LAB — COMPREHENSIVE METABOLIC PANEL
ALT: 38 U/L (ref 0–44)
AST: 33 U/L (ref 15–41)
Albumin: 3.9 g/dL (ref 3.5–5.0)
Alkaline Phosphatase: 63 U/L (ref 38–126)
Anion gap: 9 (ref 5–15)
BUN: 8 mg/dL (ref 6–20)
CO2: 25 mmol/L (ref 22–32)
Calcium: 8.6 mg/dL — ABNORMAL LOW (ref 8.9–10.3)
Chloride: 107 mmol/L (ref 98–111)
Creatinine, Ser: 1.01 mg/dL (ref 0.61–1.24)
GFR, Estimated: >60 mL/min (ref >60)
Glucose, Bld: 86 mg/dL (ref 70–99)
Potassium: 3.6 mmol/L (ref 3.5–5.1)
Sodium: 141 mmol/L (ref 135–145)
Total Bilirubin: 0.6 mg/dL (ref 0.3–1.2)
Total Protein: 7.4 g/dL (ref 6.5–8.1)

## 2020-12-05 LAB — ETHANOL: Alcohol, Ethyl (B): 113 mg/dL — ABNORMAL HIGH (ref <10)

## 2020-12-05 MED ORDER — STERILE WATER FOR INJECTION IJ SOLN
INTRAMUSCULAR | Status: AC
  Filled 2020-12-05: qty 10

## 2020-12-05 MED ORDER — ZIPRASIDONE MESYLATE 20 MG IM SOLR
INTRAMUSCULAR | Status: AC
  Administered 2020-12-05: 20 mg via INTRAMUSCULAR
  Filled 2020-12-05: qty 20

## 2020-12-05 MED ORDER — ZIPRASIDONE MESYLATE 20 MG IM SOLR: 20.0000 mg | Freq: Once | INTRAMUSCULAR | Status: AC

## 2020-12-05 NOTE — Progress Notes (Addendum)
@  11:18 PM-Pt is being reviewed by [facility] Fransico Michael, RN through the request of CSW via secure chat.   @11 :35 PM-Update with communication though review with [facility], pt is with the Fransico Michael. First shift CSW will follow up with the [place].  [place], MSW, [facility] 12/05/2020 11:34 PM

## 2020-12-05 NOTE — ED Notes (Signed)
Pt visitor arrived

## 2020-12-05 NOTE — ED Notes (Signed)
Pt starting to get very agitated, states he is going to leave, as he has some "family he needs to get to". Dr. Lynelle Doctor notified, and orders for Haldol and Ativan given. Verbal de-escalation attempted, reassured pt of safety measures that are in place for his care. Allowed pt phonecall to speak with his father, and this seemed to make him more settled, along with meds. Resting now, calmly in stretcher. VSS

## 2020-12-05 NOTE — BH Assessment (Signed)
Attempted to reach Pt by telecart.  Cart is not picking up.

## 2020-12-05 NOTE — ED Provider Notes (Signed)
Emergency Medicine Observation Re-evaluation Note  Jim Duke is a 30 y.o. male, seen on rounds today.  Pt initially presented to the ED for complaints of Suicide Attempt Currently, the patient is resting quietly.  Physical Exam  BP 113/80   Pulse 92   Temp 98.7 F (37.1 C) (Oral)   Resp (!) 23   Ht 5\' 9"  (1.753 m)   Wt 81.6 kg   SpO2 98%   BMI 26.58 kg/m  Physical Exam General: No acute distress Cardiac: Well-perfused Lungs: Nonlabored Psych: Cooperative  ED Course / MDM  EKG:EKG Interpretation  Date/Time:  Tuesday December 04 2020 23:09:38 EDT Ventricular Rate:  104 PR Interval:  139 QRS Duration: 87 QT Interval:  329 QTC Calculation: 433 R Axis:   54 Text Interpretation: Sinus tachycardia RSR' in V1 or V2, probably normal variant Abnormal inferior Q waves Since last tracing rate faster Confirmed by [id number] [phone]) on 12/04/2020 11:27:25 PM  I have reviewed the labs performed to date as well as medications administered while in observation.  Recent changes in the last 24 hours include required sedation overnight for agitation.  Plan  Current plan is for TTS evaluation. Edgel Degnan Greif is under involuntary commitment.      Everrett Coombe, MD 12/05/20 1728

## 2020-12-05 NOTE — ED Notes (Signed)
Father allowed to visit with son since he works second shift. Father has been informed the pt will go on to another inpatient psychiatric unit. Location unknown at this time. Please contact the father at transfer.
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
## 2020-12-05 NOTE — ED Notes (Addendum)
Pt stated "im not trying to be disrespectful or anything but im about to rip this iv out of me and im leaving up [place] here. My father is on the way." Pt started to pull at his iv but was able to be redirected. He agreed to allow a nurse to go talk to the doctor and come see him.

## 2020-12-05 NOTE — ED Notes (Signed)
Patient told sitter that the IV came out.  Went in to find the patient sitting up in bed.  The catheter and tubing were laying on the bedside table with the Tegaderm and tape still attached to his arm.  Removed the Tegaderm and tape from the patient.  Checked to make sure the catheter is intact.

## 2020-12-05 NOTE — ED Provider Notes (Addendum)
12:30 AM Assumed care from Dr. Lynelle Doctor, please see their note for full history, physical and decision making until this point. In brief this is a 30 y.o. year old male who presented to the ED tonight with Suicide Attempt     Attempted suicide by ingestion. Still suicidal. Pending labs for medical clearance and TTS consultations.   Patient becoming more and more agitated in the ED. Pacing the room. Feeling nervous. Will remedicate. IVC/first exam already filled out.   Labs reassuring. Vitals stable. I would consider patient medically cleared for TTS consultation.   Labs, studies and imaging reviewed by myself and considered in medical decision making if ordered. Imaging interpreted by radiology.  Labs Reviewed  COMPREHENSIVE METABOLIC PANEL - Abnormal; Notable for the following components:      Result Value   Calcium 8.6 (*)    All other components within normal limits  ETHANOL - Abnormal; Notable for the following components:   Alcohol, Ethyl (B) 113 (*)    All other components within normal limits  CBC WITH DIFFERENTIAL/PLATELET - Abnormal; Notable for the following components:   RBC 6.01 (*)    Hemoglobin 17.6 (*)    RDW 17.0 (*)    All other components within normal limits  RESP PANEL BY RT-PCR (FLU A&B, COVID) [id number]  RAPID URINE DRUG SCREEN, HOSP PERFORMED    No orders to display    No follow-ups on file.    Jessica Checketts, Barbara Cower, MD 12/05/20 Leanord Hawking    Marily Memos, MD 12/05/20 [phone]

## 2020-12-05 NOTE — ED Notes (Signed)
Dietary contacted for breakfast tray 

## 2020-12-06 ENCOUNTER — Other Ambulatory Visit: Payer: Self-pay

## 2020-12-06 ENCOUNTER — Encounter (HOSPITAL_COMMUNITY): Payer: Self-pay | Admitting: Family Medicine

## 2020-12-06 ENCOUNTER — Inpatient Hospital Stay (HOSPITAL_COMMUNITY)
Admission: AD | Admit: 2020-12-06 | Discharge: 2020-12-10 | DRG: 885 | Disposition: A | Discharge status: Home / Self Care | Payer: No Typology Code available for payment source | Source: Intra-hospital | Attending: Emergency Medicine | Admitting: Emergency Medicine

## 2020-12-06 DIAGNOSIS — T424X2A Poisoning by benzodiazepines, intentional self-harm, initial encounter: Secondary | ICD-10-CM | POA: Diagnosis present

## 2020-12-06 DIAGNOSIS — Y905 Blood alcohol level of 100-119 mg/100 ml: Secondary | ICD-10-CM | POA: Diagnosis present

## 2020-12-06 DIAGNOSIS — K30 Functional dyspepsia: Secondary | ICD-10-CM | POA: Diagnosis present

## 2020-12-06 DIAGNOSIS — F410 Panic disorder [episodic paroxysmal anxiety] without agoraphobia: Secondary | ICD-10-CM

## 2020-12-06 DIAGNOSIS — Z23 Encounter for immunization: Secondary | ICD-10-CM

## 2020-12-06 DIAGNOSIS — F329 Major depressive disorder, single episode, unspecified: Secondary | ICD-10-CM | POA: Diagnosis present

## 2020-12-06 DIAGNOSIS — G4733 Obstructive sleep apnea (adult) (pediatric): Secondary | ICD-10-CM | POA: Diagnosis present

## 2020-12-06 DIAGNOSIS — F4310 Post-traumatic stress disorder, unspecified: Secondary | ICD-10-CM

## 2020-12-06 DIAGNOSIS — F411 Generalized anxiety disorder: Secondary | ICD-10-CM | POA: Diagnosis present

## 2020-12-06 DIAGNOSIS — F10239 Alcohol dependence with withdrawal, unspecified: Secondary | ICD-10-CM | POA: Diagnosis present

## 2020-12-06 DIAGNOSIS — G4700 Insomnia, unspecified: Secondary | ICD-10-CM | POA: Diagnosis present

## 2020-12-06 DIAGNOSIS — Z20822 Contact with and (suspected) exposure to covid-19: Secondary | ICD-10-CM | POA: Diagnosis present

## 2020-12-06 DIAGNOSIS — F332 Major depressive disorder, recurrent severe without psychotic features: Principal | ICD-10-CM

## 2020-12-06 DIAGNOSIS — K5900 Constipation, unspecified: Secondary | ICD-10-CM | POA: Diagnosis present

## 2020-12-06 DIAGNOSIS — T4272XA Poisoning by unspecified antiepileptic and sedative-hypnotic drugs, intentional self-harm, initial encounter: Secondary | ICD-10-CM | POA: Diagnosis not present

## 2020-12-06 LAB — RAPID URINE DRUG SCREEN, HOSP PERFORMED
Amphetamines: NOT DETECTED
Barbiturates: NOT DETECTED
Benzodiazepines: POSITIVE — AB
Cocaine: NOT DETECTED
Opiates: NOT DETECTED
Tetrahydrocannabinol: NOT DETECTED

## 2020-12-06 LAB — RESP PANEL BY RT-PCR (FLU A&B, COVID) ARPGX2
Influenza A by PCR: NEGATIVE
Influenza B by PCR: NEGATIVE
SARS Coronavirus 2 by RT PCR: NEGATIVE

## 2020-12-06 MED ORDER — MIRTAZAPINE 15 MG PO TABS: 15.0000 mg | ORAL_TABLET | Freq: Every day | ORAL | Status: DC

## 2020-12-06 MED ORDER — INFLUENZA VAC SPLIT QUAD 0.5 ML IM SUSY
0.5000 mL | PREFILLED_SYRINGE | INTRAMUSCULAR | Status: AC
  Administered 2020-12-07: 0.5 mL via INTRAMUSCULAR
  Filled 2020-12-06: qty 0.5

## 2020-12-06 MED ORDER — ALUM & MAG HYDROXIDE-SIMETH 200-200-20 MG/5ML PO SUSP: 30.0000 mL | ORAL | Status: DC | PRN

## 2020-12-06 MED ORDER — MAGNESIUM HYDROXIDE 400 MG/5ML PO SUSP: 30.0000 mL | Freq: Every day | ORAL | Status: DC | PRN

## 2020-12-06 MED ORDER — SERTRALINE HCL 50 MG PO TABS
50.0000 mg | ORAL_TABLET | Freq: Every day | ORAL | Status: DC
  Administered 2020-12-07: 50 mg via ORAL
  Filled 2020-12-06 (×3): qty 1

## 2020-12-06 MED ORDER — SERTRALINE HCL 50 MG PO TABS
50.0000 mg | ORAL_TABLET | Freq: Every day | ORAL | Status: DC
  Administered 2020-12-06: 50 mg via ORAL
  Filled 2020-12-06: qty 1

## 2020-12-06 MED ORDER — TRAZODONE HCL 50 MG PO TABS
50.0000 mg | ORAL_TABLET | Freq: Every evening | ORAL | Status: DC | PRN
  Administered 2020-12-06: 50 mg via ORAL
  Filled 2020-12-06: qty 1

## 2020-12-06 MED ORDER — MIRTAZAPINE 15 MG PO TABS
15.0000 mg | ORAL_TABLET | Freq: Every day | ORAL | Status: DC
  Administered 2020-12-06 – 2020-12-09 (×4): 15 mg via ORAL
  Filled 2020-12-06 (×8): qty 1

## 2020-12-06 MED ORDER — ACETAMINOPHEN 325 MG PO TABS: 650.0000 mg | ORAL_TABLET | Freq: Four times a day (QID) | ORAL | Status: DC | PRN

## 2020-12-06 MED ORDER — HYDROXYZINE HCL 25 MG PO TABS
25.0000 mg | ORAL_TABLET | Freq: Three times a day (TID) | ORAL | Status: DC | PRN
  Administered 2020-12-06 – 2020-12-09 (×4): 25 mg via ORAL
  Filled 2020-12-06 (×4): qty 1

## 2020-12-06 NOTE — [facility] Group Notes (Signed)
[facility] Group Notes:  (Nursing/MHT/Case Management/Adjunct)  Date:  12/06/2020  Time:  9:18 PM  Type of Therapy:   wrap up  Participation Level:  Active  Participation Quality:  Attentive  Affect:  Appropriate  Cognitive:  Appropriate  Insight:  Appropriate  Engagement in Group:  Developing/Improving  Modes of Intervention:  Discussion  Summary of Progress/Problems: PT participated in group, said he is wanting to speak more about his daughter who passed away in 2014/07/14. He wants to get more comfortable  speaking about her out loud.   Lorita Officer 12/06/2020, 9:18 PM

## 2020-12-06 NOTE — Progress Notes (Signed)
Admission Note: Patient is a 30 year old male admitted to the unit under IVC from [facility] for symptoms of depression and intentional overdose by ingesting 10 1 mg tablet of Ativan and 10 tabs of 10 mg of Ambien.  Patient blamed it on poor impulse control and being overwhelmed with anniversary of daughter's death.  Patient is alert and oriented x 4.  Presents with a flat affect and depressed mood.  States he is here to learn to control his emotions and to be able to talk about his daughter in a positive way.  Admission plan of care reviewed with consent for treatment signed.  Skin assessment and personal belongings completed.  Skin is dry and intact.  No contraband found.  Patient oriented to the unit, staff and room.  Routine safety checks initiated.  Verbalizes understanding of unit rules/protocols.  Support and encouragement offered as needed.  Patient is safe on the unit at this time.

## 2020-12-06 NOTE — ED Notes (Signed)
Report given to Will RN at [facility]; pt to be transferred to 307-2

## 2020-12-06 NOTE — ED Notes (Signed)
Pt resting comfortably, pt updated on POC

## 2020-12-06 NOTE — ED Provider Notes (Signed)
Emergency Medicine Observation Re-evaluation Note  Jim Duke is a 30 y.o. male, seen on rounds today.  Pt initially presented to the ED for complaints of feeling depressed, suicidal thoughts, ?overdose.   Physical Exam  BP (!) 146/89 (BP Location: Left Arm)   Pulse 78   Temp 98.3 F (36.8 C) (Oral)   Resp 17   Ht 1.753 m (5\' 9" )   Wt 81.6 kg   SpO2 100%   BMI 26.58 kg/m  Physical Exam General: resting, nad.  Cardiac: regular rate Lungs: breathing comfortably Psych: calm. Pt is not responding to internal stimuli - no delusions or hallucinations.   ED Course / MDM   I have reviewed the labs performed to date as well as medications administered while in observation.  Recent changes in the last 24 hours include ED observation and reassessment.   Plan  BH team is recommending inpatient psych tx.   Pt started on prior meds, zoloft/remeron.  Disposition per [facility] team, reassessment pending this AM.          [facility], MD 12/06/20 [phone]

## 2020-12-06 NOTE — Progress Notes (Signed)
Pt accepted to [facility] 307-2   Patient meets inpatient criteria per Dr. Lucianne Muss   Dr. Mason Jim is the attending provider.    Call report to [phone]    Fransico Him, RN @ [facility] notified.     Pt scheduled  to arrive at [facility] TODAY. Pt's bed is available now and all required documents have been received.   Damita Dunnings, MSW, LCSW-A  2:52 PM 12/06/2020

## 2020-12-06 NOTE — Tx Team (Signed)
Initial Treatment Plan 12/06/2020 6:07 PM Jim Duke QMG:[id number]    PATIENT STRESSORS: Financial difficulties   Health problems     PATIENT STRENGTHS: Ability for insight  Communication skills  Motivation for treatment/growth    PATIENT IDENTIFIED PROBLEMS: "Control my emotions"  "To be able to talk about my daughter in a positive way"  Suicidal ideation  Depression  Hallucinations             DISCHARGE CRITERIA:  Ability to meet basic life and health needs Adequate post-discharge living arrangements Motivation to continue treatment in a less acute level of care  PRELIMINARY DISCHARGE PLAN: Attend aftercare/continuing care group Outpatient therapy Return to previous living arrangement  PATIENT/FAMILY INVOLVEMENT: This treatment plan has been presented to and reviewed with the patient, Jim Duke, and/or family member.  The patient and family have been given the opportunity to ask questions and make suggestions.  Clarene Critchley, RN 12/06/2020, 6:07 PM

## 2020-12-06 NOTE — Progress Notes (Signed)
CSW contacted locate [facility] hospitals regarding bed availability. Two locations are on capacity alert due to no beds available with extensive waitlist and the other two are on diversion. Patient is under review at [facility] and has a pending acceptance with negative COVID and UDS.   Damita Dunnings, MSW, LCSW-A  11:21 AM 12/06/2020

## 2020-12-06 NOTE — ED Notes (Signed)
Pt calm and cooperative, sitter outside room for pt safety

## 2020-12-06 NOTE — Progress Notes (Signed)
CSW contacted [facility] who informed her that there is bed availability but they are currently on capacity alert. RN informed CSW to contact her again after their morning meeting, to determine if they are anymore openings within the [place] system and whether are not they will be on diversion.    Damita Dunnings, MSW, LCSW-A  8:26 AM 12/06/2020

## 2020-12-06 NOTE — Progress Notes (Signed)
   12/06/20 2106  Psych Admission Type (Psych Patients Only)  Admission Status Involuntary  Psychosocial Assessment  Patient Complaints Depression;Sadness  Eye Contact Brief  Facial Expression Flat;Sullen;Sad  Affect Appropriate to circumstance;Sad  Speech Logical/coherent  Interaction Assertive;Minimal  Motor Activity Other (Comment) (WDL)  Appearance/Hygiene In scrubs  Behavior Characteristics Cooperative;Appropriate to situation  Mood Depressed;Sad;Sullen  Thought Process  Coherency WDL  Content WDL  Delusions None reported or observed  Perception WDL  Hallucination Auditory;Visual  Judgment Impaired  Confusion None  Danger to Self  Current suicidal ideation? Denies  Danger to Others  Danger to Others None reported or observed

## 2020-12-07 ENCOUNTER — Encounter (HOSPITAL_COMMUNITY): Payer: Self-pay

## 2020-12-07 DIAGNOSIS — F410 Panic disorder [episodic paroxysmal anxiety] without agoraphobia: Secondary | ICD-10-CM

## 2020-12-07 DIAGNOSIS — F332 Major depressive disorder, recurrent severe without psychotic features: Secondary | ICD-10-CM

## 2020-12-07 DIAGNOSIS — F4310 Post-traumatic stress disorder, unspecified: Secondary | ICD-10-CM

## 2020-12-07 DIAGNOSIS — F411 Generalized anxiety disorder: Secondary | ICD-10-CM

## 2020-12-07 LAB — LIPID PANEL
Cholesterol: 243 mg/dL — ABNORMAL HIGH (ref 0–200)
HDL: 39 mg/dL — ABNORMAL LOW (ref >40)
LDL Cholesterol: 178 mg/dL — ABNORMAL HIGH (ref 0–99)
Total CHOL/HDL Ratio: 6.2 RATIO
Triglycerides: 131 mg/dL (ref <150)
VLDL: 26 mg/dL (ref 0–40)

## 2020-12-07 LAB — TSH: TSH: 2.009 u[IU]/mL (ref 0.350–4.500)

## 2020-12-07 LAB — HEMOGLOBIN A1C
Hgb A1c MFr Bld: 4.9 % (ref 4.8–5.6)
Mean Plasma Glucose: 93.93 mg/dL

## 2020-12-07 MED ORDER — VENLAFAXINE HCL ER 37.5 MG PO CP24
37.5000 mg | ORAL_CAPSULE | Freq: Every day | ORAL | Status: AC
  Administered 2020-12-08 – 2020-12-09 (×2): 37.5 mg via ORAL
  Filled 2020-12-07 (×2): qty 1

## 2020-12-07 MED ORDER — SERTRALINE HCL 50 MG PO TABS
50.0000 mg | ORAL_TABLET | Freq: Every day | ORAL | Status: AC
  Administered 2020-12-08: 50 mg via ORAL
  Filled 2020-12-07: qty 1

## 2020-12-07 MED ORDER — VENLAFAXINE HCL ER 75 MG PO CP24
75.0000 mg | ORAL_CAPSULE | Freq: Once | ORAL | Status: DC
  Filled 2020-12-07: qty 1

## 2020-12-07 MED ORDER — SERTRALINE HCL 25 MG PO TABS
25.0000 mg | ORAL_TABLET | Freq: Every day | ORAL | Status: AC
  Administered 2020-12-09: 25 mg via ORAL
  Filled 2020-12-07: qty 1

## 2020-12-07 MED ORDER — PRAZOSIN HCL 1 MG PO CAPS
1.0000 mg | ORAL_CAPSULE | Freq: Every day | ORAL | Status: DC
  Administered 2020-12-07 – 2020-12-08 (×2): 1 mg via ORAL
  Filled 2020-12-07 (×5): qty 1

## 2020-12-07 MED ORDER — LORAZEPAM 1 MG PO TABS: 1.0000 mg | ORAL_TABLET | Freq: Four times a day (QID) | ORAL | Status: DC | PRN

## 2020-12-07 NOTE — [facility] Group Notes (Signed)
Patient expressed  depression because of his daughters death, and needing ways to cope with his depression. Reminded patient to continue to be complaint with his medication regime as that would help him cope. Dicussed with patient that when very upsetting things happen in life we can not change the feelings we have right away. We have to be able to cope with painful events and emotions when we cannot make things better right away.Patient was not able to articulate things he could do, as he felt it was just the grieving process. Suggested some self-soothing exercises to promote self-care so that he could be receptive to practicing coping skills that would make him feel less helpless.

## 2020-12-07 NOTE — Group Note (Signed)
LCSW Group Therapy Note  Group Date: 12/07/2020 Start Time: 1300 End Time: 1345   Type of Therapy and Topic:  Group Therapy - Healthy vs Unhealthy Coping Skills  Participation Level:  Active   Description of Group The focus of this group was to determine what unhealthy coping techniques typically are used by group members and what healthy coping techniques would be helpful in coping with various problems. Patients were guided in becoming aware of the differences between healthy and unhealthy coping techniques. Patients were asked to identify 2-3 healthy coping skills they would like to learn to use more effectively.  Therapeutic Goals Patients learned that coping is what human beings do all day long to deal with various situations in their lives Patients defined and discussed healthy vs unhealthy coping techniques Patients identified their preferred coping techniques and identified whether these were healthy or unhealthy Patients determined 2-3 healthy coping skills they would like to become more familiar with and use more often. Patients provided support and ideas to each other     Therapeutic Modalities Cognitive Behavioral Therapy Motivational Interviewing  Jim Duke 12/07/2020  2:31 PM

## 2020-12-07 NOTE — Progress Notes (Signed)
DAR NOTE: Patient presents with anxious affect and depressed mood.  Denies suicidal thoughts, pain, auditory and visual hallucinations.  Rates depression at 2, hopelessness at 2, and anxiety at 7.  Maintained on routine safety checks.  Medications given as prescribed.  Patient requested and received Vistaril 25 mg for complain of anxiety with good effect.  Support and encouragement offered as needed.  Attended group and participated.  States goal for today is "staying positive."  Patient observed socializing with peers in the dayroom.  Patient is safe on and off the unit.

## 2020-12-07 NOTE — H&P (Addendum)
Psychiatric Admission Assessment Adult  Patient Identification: Jim Duke MRN:  [id number] Date of Evaluation:  12/07/2020 Chief Complaint:  MDD (major depressive disorder) [F32.9] Principal Diagnosis: Major depressive disorder, recurrent episode, severe (HCC) Diagnosis:  Principal Problem:   Major depressive disorder, recurrent episode, severe (HCC) Active Problems:   GAD (generalized anxiety disorder)   Panic disorder   PTSD (post-traumatic stress disorder)  History of Present Illness: Patient is a 30 year old male with history of GAD, PTSD, MDD, and grief presents to [facility] from [facility] ED due to suicide attempt via overdose.  CHART REVIEW This is patient's 2nd psychiatric hospitalization. Patient has been to [facility] last year with similar presentation. Patient is normally seen at the [facility] for medications management and health assessments.   Pertinent labs include UDS positive for benzodiazapene, negative COVID PCR, alcohol level 113, hgb 17.6, wnl a1c, lipid panel wnl, TSH wnl.   TODAY'S INTERVIEW Patient said he had been particularly emotional this past week since it was his deceased daughter's birthday. Patient has been struggling with coping skills to appropriately handle significant events related to her daughter including her birthday and the day she died. Pt reports having drank alcohol prior to overdose.  Patient states he felt ashamed of his suicide attempt due to feeling that he would not see his daughter in heaven if he had died and gone to hell. Pt reports previous admission to psychiatric unit, around this same time of year, due to suicide ideations.   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 Patient states he is estranged from siblings as he feels they and his ex-wife had been complicit to death of daughter. He reports flashbacks, night mares, avoidence  symptoms and negative mood due to traumatic exposures.  Patient also states he has active nightmares regarding time spent in [employer]. Patient has been on prazosin but is unable to specify the dosage. Patient states he still has persistent nightmares while on prazosin. Patient also states he sleeps 3-4 hours a day waking up multiple times per day. Patient states he has adjusted to it and "it is just the way it is". Patient states he takes [place] that sometimes works but not to great effect.  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 Patient is now 100% on disability for PTSD, OSA and arthritis. Patient is looking for a place near father as father's health is poor. Patient mentions also that he has been to a 31 day intensive grief therapy that greatly benefited him.  Patient also has a history of depression that patient states primarily stems from death of daughter. Patient states he has depressed mood, some anhedonia, felt poor energy, poor sleep, depressed mood, guilt. Patient has been on sertraline 100 mg qd for this. Patient states it appropriately manages his depression but it does not help greatly with his anxiety or panic attacks.  Pt reports high level of anxiety, worry, feeling on edge, impaired concentration, and poor sleep. He reports that anxiety symptoms are not currently well managed with current mediations.  Patient states he has panic attacks that regularly occur 4-5 times a day. Patient states that his therapy dog has been of great help with this as well as for his anxiety. Patient states his panic attacks last 3-5 minutes long and he describes having tunnel vision. Patient states ativan does not help with his panic attacks. Patient also states he is  at times fearful of leaving the house even to get food because he is fearful of having a panic attack outside of the hospital.   Patient reports less suicidal thoughts since overdose. Patient feels anxious regarding his therapy dog that is  at father's house. Patient states he also feels ashamed regarding what he had done that led him to the ED. Patient has a strong desire to leave hospital to see dog but is agreeable to continued admission for more appropriate medication management of his anxiety and panic attacks.     Associated Signs/Symptoms: Depression Symptoms:  depressed mood, anhedonia, insomnia, fatigue, recurrent thoughts of death, anxiety, panic attacks, loss of energy/fatigue, Duration of Depression Symptoms: Greater than two weeks  (Hypo) Manic Symptoms:   none Anxiety Symptoms:  Excessive Worry, Panic Symptoms, Psychotic Symptoms:   none PTSD Symptoms: Had a traumatic exposure:  yes Hypervigilance:  Yes Hyperarousal:  Difficulty Concentrating Increased Startle Response Sleep Total Time spent with patient:  I personally spent 60 minutes on the unit in direct patient care. The direct patient care time included face-to-face time with the patient, reviewing the patient's chart, communicating with other professionals, and coordinating care. Greater than 50% of this time was spent in counseling or coordinating care with the patient regarding goals of hospitalization, psycho-education, and discharge planning needs.   Past Psychiatric History: MDD, PTSD,GAD. H/o psychiatric hospitalizations for suicide ideation.  Psych med hx: Sertraline - current, not helpful for anxiety, ptsd, panic attacks Lorazepam - current, not helpful for aborting panic attacks Ambien - current, not helpful for treatment of insomnia.  Trazodone - previous, not helpful for insomnia Mirtazapine - previous, unsure if helpful for insomnia   Is the patient at risk to self? Yes.    Has the patient been a risk to self in the past 6 months? No.  Has the patient been a risk to self within the distant past? No.  Is the patient a risk to others? No.  Has the patient been a risk to others in the past 6 months? No.  Has the patient been a risk  to others within the distant past? No.   Prior Inpatient Therapy: yes  Prior Outpatient Therapy:  yes  Alcohol Screening: Patient refused Alcohol Screening Tool: Yes 1. How often do you have a drink containing alcohol?: Monthly or less 2. How many drinks containing alcohol do you have on a typical day when you are drinking?: 1 or 2 3. How often do you have six or more drinks on one occasion?: Never AUDIT-C Score: 1 Substance Abuse History in the last 12 months:  No. Consequences of Substance Abuse: NA Previous Psychotropic Medications: Yes  Psychological Evaluations: No  Past Medical History:  Past Medical History:  Diagnosis Date   Anxiety    Depression    PTSD (post-traumatic stress disorder)    Seizures (HCC)     Past Surgical History:  Procedure Laterality Date   NOSE SURGERY     Family History:  Family History  Problem Relation Age of Onset   Alcoholism Father    Family Psychiatric  History: None Tobacco Screening:   Social History:  Social History   Substance and Sexual Activity  Alcohol Use Yes   Comment: occ     Social History   Substance and Sexual Activity  Drug Use Not Currently    Additional Social History: Marital status: Single Are you sexually active?: Yes What is your sexual orientation?: Heterosexual Has your sexual activity been affected by  drugs, alcohol, medication, or emotional stress?: No Does patient have children?: Yes How many children?: 1 How is patient's relationship with their children?: Pt reports having 1 daughter who passed away in 1932 at 90 years old                         Allergies:  No Known Allergies Lab Results:  Results for orders placed or performed during the hospital encounter of 12/06/20 (from the past 48 hour(s))  Hemoglobin A1c     Status: None   Collection Time: 12/07/20  6:31 AM  Result Value Ref Range   Hgb A1c MFr Bld 4.9 4.8 - 5.6 %    Comment: (NOTE) Pre diabetes:           5.7%-6.4%  Diabetes:              >6.4%  Glycemic control for   <7.0% adults with diabetes    Mean Plasma Glucose 93.93 mg/dL    Comment: Performed at [facility], [address], [place], [place] [address]  Lipid panel     Status: Abnormal   Collection Time: 12/07/20  6:31 AM  Result Value Ref Range   Cholesterol 243 (H) 0 - 200 mg/dL   Triglycerides 604 <540 mg/dL   HDL 39 (L) >98 mg/dL   Total CHOL/HDL Ratio 6.2 RATIO   VLDL 26 0 - 40 mg/dL   LDL Cholesterol 119 (H) 0 - 99 mg/dL    Comment:        Total Cholesterol/HDL:CHD Risk Coronary Heart Disease Risk Table                     Men   Women  1/2 Average Risk   3.4   3.3  Average Risk       5.0   4.4  2 X Average Risk   9.6   7.1  3 X Average Risk  23.4   11.0        Use the calculated Patient Ratio above and the CHD Risk Table to determine the patient's CHD Risk.        ATP III CLASSIFICATION (LDL):  <100     mg/dL   Optimal  147-829  mg/dL   Near or Above                    Optimal  130-159  mg/dL   Borderline  562-130  mg/dL   High  >865     mg/dL   Very High Performed at [facility], [address], [place], [place] [address]   TSH     Status: None   Collection Time: 12/07/20  6:31 AM  Result Value Ref Range   TSH 2.009 0.350 - 4.500 uIU/mL    Comment: Performed by a 3rd Generation assay with a functional sensitivity of <=0.01 uIU/mL. Performed at [facility], [address] [place], [place] [address]     Blood Alcohol level:  Lab Results  Component Value Date   ETH 113 (H) 12/04/2020   ETH <10 07/14/2018    Metabolic Disorder Labs:  Lab Results  Component Value Date   HGBA1C 4.9 12/07/2020   MPG 93.93 12/07/2020   MPG 96.8 07/15/2018   No results found for: PROLACTIN Lab Results  Component Value Date   CHOL 243 (H) 12/07/2020   TRIG 131 12/07/2020   HDL 39 (L) 12/07/2020  CHOLHDL 6.2 12/07/2020   VLDL 26 12/07/2020   LDLCALC 178 (H)  12/07/2020    Current Medications: Current Facility-Administered Medications  Medication Dose Route Frequency Provider Last Rate Last Admin   acetaminophen (TYLENOL) tablet 650 mg  650 mg Oral Q6H PRN Novella Olive, NP       alum & mag hydroxide-simeth (MAALOX/MYLANTA) 200-200-20 MG/5ML suspension 30 mL  30 mL Oral Q4H PRN Novella Olive, NP       hydrOXYzine (ATARAX/VISTARIL) tablet 25 mg  25 mg Oral TID PRN Novella Olive, NP   25 mg at 12/07/20 1207   LORazepam (ATIVAN) tablet 1 mg  1 mg Oral Q6H PRN Loretta Doutt, Harrold Donath, MD       magnesium hydroxide (MILK OF MAGNESIA) suspension 30 mL  30 mL Oral Daily PRN Novella Olive, NP       mirtazapine (REMERON) tablet 15 mg  15 mg Oral QHS Novella Olive, NP   15 mg at 12/06/20 2106   prazosin (MINIPRESS) capsule 1 mg  1 mg Oral QHS Ad Guttman, Harrold Donath, MD       Melene Muller ON 12/09/2020] sertraline (ZOLOFT) tablet 25 mg  25 mg Oral Daily Jordyne Poehlman, Harrold Donath, MD       Melene Muller ON 12/08/2020] sertraline (ZOLOFT) tablet 50 mg  50 mg Oral Daily Yacine Garriga, Harrold Donath, MD       Melene Muller ON 12/08/2020] venlafaxine XR (EFFEXOR-XR) 24 hr capsule 37.5 mg  37.5 mg Oral Q breakfast Leon Goodnow, Harrold Donath, MD       Melene Muller ON 12/10/2020] venlafaxine XR (EFFEXOR-XR) 24 hr capsule 75 mg  75 mg Oral Once Phineas Inches, MD       PTA Medications: Medications Prior to Admission  Medication Sig Dispense Refill Last Dose   LORazepam (ATIVAN) 1 MG tablet Take 1 mg by mouth in the morning, at noon, and at bedtime.      sertraline (ZOLOFT) 100 MG tablet Take 1 tablet by mouth daily at 8 pm.      zolpidem (AMBIEN) 10 MG tablet Take 1 tablet by mouth at bedtime as needed.       Musculoskeletal: Strength & Muscle Tone: within normal limits Gait & Station: normal Patient leans: N/A            Psychiatric Specialty Exam:  Presentation  General Appearance: Appropriate for Environment; Fairly Groomed  Eye Contact:Good  Speech:Clear and Coherent  Speech  Volume:Normal  Handedness: No data recorded  Mood and Affect  Mood:Anxious; Depressed  Affect:Appropriate; Congruent; Depressed   Thought Process  Thought Processes:Coherent; Linear  Duration of Psychotic Symptoms: No data recorded Past Diagnosis of Schizophrenia or Psychoactive disorder: No  Descriptions of Associations:Intact  Orientation:Full (Time, Place and Person)  Thought Content:Logical  Hallucinations:Hallucinations: None  Ideas of Reference:None  Suicidal Thoughts:Suicidal Thoughts: No  Homicidal Thoughts:Homicidal Thoughts: No   Sensorium  Memory:Immediate Good; Recent Good; Remote Good  Judgment:Intact  Insight:Fair   Executive Functions  Concentration:Good  Attention Span:Good  Recall:Good  Fund of Knowledge:Good  Language:Good   Psychomotor Activity  Psychomotor Activity:Psychomotor Activity: Normal   Assets  Assets:Physical Health; Social Support; Manufacturing systems engineer; Desire for Improvement; Housing   Sleep  Sleep:Sleep: Poor    Physical Exam: Physical Exam Vitals and nursing note reviewed.  Constitutional:      Appearance: Normal appearance. He is normal weight.  HENT:     Head: Normocephalic and atraumatic.  Pulmonary:     Effort: Pulmonary effort is normal. No respiratory distress.     Breath sounds:  Normal breath sounds. No stridor. No wheezing or rhonchi.  Neurological:     General: No focal deficit present.     Mental Status: He is oriented to person, place, and time.     Cranial Nerves: No cranial nerve deficit.     Sensory: No sensory deficit.     Motor: No weakness.     Coordination: Coordination normal.     Gait: Gait normal.   Review of Systems  Respiratory:  Negative for shortness of breath.   Cardiovascular:  Negative for chest pain.  Gastrointestinal:  Negative for abdominal pain, constipation, diarrhea, heartburn, nausea and vomiting.  Neurological:  Negative for headaches.  Blood pressure (!)  162/97, pulse 86, temperature 98.9 F (37.2 C), resp. rate 19, height  (1.753 m), weight 82.1 kg, SpO2 99 %. Body mass index is 26.73 kg/m.  Treatment Plan Summary: Daily contact with patient to assess and evaluate symptoms and progress in treatment and Medication management  ASSESSMENT Patient is a 30 year old male with history of GAD, PTSD, MDD, and grief presents to [facility] from [facility] ED due to suicide attempt via overdose. Patient agreeable to cross taper to Effexor and attempting using Remeron as sleep aid as opposed to [place].  Psychiatric Problems MDD recurrent severe w/o psychosis GAD with panic attacks versus GAD and panic disorder as separate diagnoses  PTSD R/o Alcohol Use Disorder  Alcohol Withdrawal -On CIWA protocol for 72 hours - for possible benzodiazepine withdrawal  -Hydroxyzine 25 mg tid prn for anxiety -Remeron 15 mg for MDD and sleep -Sertraline 50 mg 10/1, 25 mg 10/2, then stop (cross taper to effexor) -Effexor 37.5 for 2 days then begin effexor XR 75 mg 10/3 for panic disorder  -Prazosin 1 mg for nightmares/PTSD. Will likely need to titrate up as well as to manage blood pressure  Medical Problems Received Flu shot  PRNs Maalox for indigestion Milk of Magnesia for constipation Tylenol for pain  3. Safety and Monitoring: Involuntary admission to inpatient psychiatric unit for safety, stabilization and treatment Daily contact with patient to assess and evaluate symptoms and progress in treatment Patient's case to be discussed in multi-disciplinary team meeting Observation Level : q15 minute checks Vital signs: q12 hours Precautions: suicide, elopement, and assault   4. Discharge Planning: Social work and case management to assist with discharge planning and identification of hospital follow-up needs prior to discharge Estimated LOS: 5-7 days Discharge Concerns: Need to establish a safety plan; Medication compliance and effectiveness Discharge  Goals: Return home with outpatient referrals for mental health follow-up including medication management/psychotherapy  Observation Level/Precautions:  15 minute checks  Laboratory:  CBC Chemistry Profile  Psychotherapy:  supportive, group therapy   Medications:  see above  Consultations:    Discharge Concerns:  v[facility]an, [facility]  Estimated LOS: 5-7 days  Other:  involuntary admission    Physician Treatment Plan for Primary Diagnosis: Major depressive disorder, recurrent episode, severe (HCC) Long Term Goal(s): Improvement in symptoms so as ready for discharge  Short Term Goals: Ability to identify changes in lifestyle to reduce recurrence of condition will improve, Ability to verbalize feelings will improve, Ability to disclose and discuss suicidal ideas, Ability to demonstrate self-control will improve, Ability to identify and develop effective coping behaviors will improve, Ability to maintain clinical measurements within normal limits will improve, Compliance with prescribed medications will improve, and Ability to identify triggers associated with substance abuse/mental health issues will improve  Physician Treatment Plan for Secondary Diagnosis: Principal Problem:  Major depressive disorder, recurrent episode, severe (HCC) Active Problems:   GAD (generalized anxiety disorder)   Panic disorder   PTSD (post-traumatic stress disorder)  Long Term Goal(s): Improvement in symptoms so as ready for discharge  Short Term Goals: Ability to identify changes in lifestyle to reduce recurrence of condition will improve, Ability to verbalize feelings will improve, Ability to disclose and discuss suicidal ideas, Ability to demonstrate self-control will improve, Ability to identify and develop effective coping behaviors will improve, Ability to maintain clinical measurements within normal limits will improve, Compliance with prescribed medications will improve, and Ability to identify triggers  associated with substance abuse/mental health issues will improve  I certify that inpatient services furnished can reasonably be expected to improve the patient's condition.    Park Pope, MD 9/30/20228:44 PM

## 2020-12-07 NOTE — Progress Notes (Signed)
Psychoeducational Group Note  Date:  12/07/2020 Time:  2132  Group Topic/Focus:  Wrap-Up Group:   The focus of this group is to help patients review their daily goal of treatment and discuss progress on daily workbooks.  Participation Level: Did Not Attend  Participation Quality:  Not Applicable  Affect:  Not Applicable  Cognitive:  Not Applicable  Insight:  Not Applicable  Engagement in Group: Not Applicable  Additional Comments:  The patient did not attend group this evening.   Hazle Coca S 12/07/2020, 9:33 PM

## 2020-12-07 NOTE — BH IP Treatment Plan (Signed)
Interdisciplinary Treatment and Diagnostic Plan Update  12/07/2020 Time of Session: 9:30am  Jim Duke MRN: [id number]  Principal Diagnosis: Major depressive disorder, recurrent episode, severe ([place])  Secondary Diagnoses: Principal Problem:   Major depressive disorder, recurrent episode, severe ([place]) Active Problems:   GAD (generalized anxiety disorder)   Panic disorder   PTSD (post-traumatic stress disorder)   Current Medications:  Current Facility-Administered Medications  Medication Dose Route Frequency Provider Last Rate Last Admin   acetaminophen (TYLENOL) tablet 650 mg  650 mg Oral Q6H PRN Chalmers Guest, NP       alum & mag hydroxide-simeth (MAALOX/MYLANTA) 200-200-20 MG/5ML suspension 30 mL  30 mL Oral Q4H PRN Chalmers Guest, NP       hydrOXYzine (ATARAX/VISTARIL) tablet 25 mg  25 mg Oral TID PRN Chalmers Guest, NP   25 mg at 12/07/20 1207   magnesium hydroxide (MILK OF MAGNESIA) suspension 30 mL  30 mL Oral Daily PRN Chalmers Guest, NP       mirtazapine (REMERON) tablet 15 mg  15 mg Oral QHS Chalmers Guest, NP   15 mg at 12/06/20 2106   sertraline (ZOLOFT) tablet 50 mg  50 mg Oral Daily Chalmers Guest, NP   50 mg at 12/07/20 9450   traZODone (DESYREL) tablet 50 mg  50 mg Oral QHS PRN Chalmers Guest, NP   50 mg at 12/06/20 2106   PTA Medications: Medications Prior to Admission  Medication Sig Dispense Refill Last Dose   LORazepam (ATIVAN) 1 MG tablet Take 1 mg by mouth in the morning, at noon, and at bedtime.      sertraline (ZOLOFT) 100 MG tablet Take 1 tablet by mouth daily at 8 pm.      zolpidem (AMBIEN) 10 MG tablet Take 1 tablet by mouth at bedtime as needed.       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   Physician Treatment Plan for Primary Diagnosis: Major depressive disorder, recurrent episode, severe ([place]) Long Term Goal(s):     Short Term Goals:    Medication Management: Evaluate patient's response, side effects, and tolerance of medication regimen.  Therapeutic Interventions: 1 to 1 sessions, Unit Group sessions and Medication administration.  Evaluation of Outcomes: Not Met  Physician Treatment Plan for Secondary Diagnosis: Principal Problem:   Major depressive disorder, recurrent episode, severe ([facility]) Active Problems:   GAD (generalized anxiety disorder)   Panic disorder   PTSD (post-traumatic stress disorder)  Long Term Goal(s):     Short Term Goals:       Medication Management: Evaluate patient's response, side effects, and tolerance of medication regimen.  Therapeutic Interventions: 1 to 1 sessions, Unit Group sessions and Medication administration.  Evaluation of Outcomes: Not Met   RN Treatment Plan for Primary Diagnosis: Major depressive disorder, recurrent episode, severe ([place]) Long Term Goal(s): Knowledge of disease and therapeutic regimen to maintain health will improve  Short Term Goals: Ability to remain free from injury will improve, Ability to participate in decision making will improve, Ability to verbalize feelings will improve, Ability to disclose and discuss suicidal ideas, and Ability to identify and develop effective coping behaviors will improve  Medication Management: RN will administer medications as ordered by provider, will assess and evaluate patient's response and  provide education to patient for prescribed medication. RN will report any adverse and/or side effects to prescribing provider.  Therapeutic Interventions: 1 on 1 counseling sessions, Psychoeducation, Medication administration, Evaluate responses to treatment, Monitor vital signs and CBGs as ordered, Perform/monitor CIWA, COWS, AIMS and Fall Risk screenings as  ordered, Perform wound care treatments as ordered.  Evaluation of Outcomes: Not Met   LCSW Treatment Plan for Primary Diagnosis: Major depressive disorder, recurrent episode, severe ([place]) Long Term Goal(s): Safe transition to appropriate next level of care at discharge, Engage patient in therapeutic group addressing interpersonal concerns.  Short Term Goals: Engage patient in aftercare planning with referrals and resources, Increase social support, Increase emotional regulation, Facilitate acceptance of mental health diagnosis and concerns, Identify triggers associated with mental health/substance abuse issues, and Increase skills for wellness and recovery  Therapeutic Interventions: Assess for all discharge needs, 1 to 1 time with Social worker, Explore available resources and support systems, Assess for adequacy in community support network, Educate family and significant other(s) on suicide prevention, Complete Psychosocial Assessment, Interpersonal group therapy.  Evaluation of Outcomes: Not Met   Progress in Treatment: Attending groups: Yes. Participating in groups: Yes. Taking medication as prescribed: Yes. Toleration medication: Yes. Family/Significant other contact made: Yes, individual(s) contacted:  Father  Patient understands diagnosis: Yes. Discussing patient identified problems/goals with staff: Yes. Medical problems stabilized or resolved: Yes. Denies suicidal/homicidal ideation: Yes. Issues/concerns per patient self-inventory: No.   New problem(s) identified: No, Describe:  None   New Short Term/Long Term Goal(s): medication stabilization, elimination of SI thoughts, development of comprehensive mental wellness plan.   Patient Goals: "To be optimistic and to go to therapy"   Discharge Plan or Barriers: Patient recently admitted. CSW will continue to follow and assess for appropriate referrals and possible discharge planning.   Reason for Continuation of  Hospitalization: Anxiety Depression Medication stabilization Suicidal ideation  Estimated Length of Stay: 3 to 5 days    Scribe for Treatment Team: Darleen Crocker, Latanya Presser 12/07/2020 2:29 PM

## 2020-12-07 NOTE — Group Note (Signed)
Recreation Therapy Group Note   Group Topic:Team Building  Group Date: 12/07/2020 Start Time: 1010 End Time: 1050 Facilitators: Caroll Rancher, LRT/CTRS Location: [address] Dayroom  Goal Area(s) Addresses:  Patient will effectively work with peer towards shared goal.  Patient will identify skills used to make activity successful.  Patient will identify how skills used during activity can be applied to reach post d/c goals.   Group Description:Tallest [employer]. In teams of 5-6, patients were given 25 small craft pipe cleaners. Using the materials provided, patients were instructed to compete againt the opposing team(s) to build the tallest free-standing structure from floor level. The activity was timed; difficulty increased by Clinical research associate as Production designer, theatre/television/film continued.  Systematically resources were removed with additional directions for example, placing one arm behind their back, working in silence, and shape stipulations. LRT facilitated post-activity discussion reviewing team processes and necessary communication skills involved in completion. Patients were encouraged to reflect how the skills utilized, or not utilized, in this activity can be incorporated to positively impact support systems post discharge.   Affect/Mood: Appropriate   Participation Level: Moderate   Participation Quality: Independent   Behavior: Appropriate   Speech/Thought Process: Relevant   Insight: Good   Judgement: Good   Modes of Intervention: Problem-solving   Patient Response to Interventions:  Attentive and Receptive   Education Outcome:  Acknowledges education and In group clarification offered    Clinical Observations/Individualized Feedback: Pt arrived to group for the last 10-15 minutes.  Pt attempted to complete the activity with a peer.  Pt was quiet and observant.     Plan: Continue to engage patient in RT group sessions 2-3x/week.   Caroll Rancher, LRT/CTRS 12/07/2020 11:51  AM
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
## 2020-12-07 NOTE — [facility] Suicide Risk Assessment (Addendum)
[facility] Admission Suicide Risk Assessment   Nursing information obtained from:  Patient Demographic factors:  Male Current Mental Status:  Self-harm thoughts Loss Factors:  Loss of significant relationship, Financial problems / change in socioeconomic status Historical Factors:  Impulsivity Risk Reduction Factors:  Living with another person, especially a relative  Total Time spent with patient: 1 hour Principal Problem: Major depressive disorder, recurrent episode, severe (HCC) Diagnosis:  Principal Problem:   Major depressive disorder, recurrent episode, severe (HCC) Active Problems:   GAD (generalized anxiety disorder)   Panic disorder   PTSD (post-traumatic stress disorder)  Subjective Data: 30 year old male veteran with a psychiatric history of major depressive disorder, generalized anxiety disorder, PTSD was admitted to the psychiatric unit on December 07, 2020 after suicide attempt by overdose.  Patient reports on day of attempt, this is the anniversary of his daughter's death.  Daughter died at age of 71 years old.  Patient reports having similar abrupt acute decline in mood and onset of suicidal thoughts around the time of the anniversary of daughter's death in the past. Patient has history of psychiatric admission for suicidal thoughts.  Patient reports having history of major depressive disorder for many years.  He reports his depressive symptoms are fairly well controlled on current medication, however he reports that anxiety and panic disorder symptoms are not well controlled on current medication regimen.  He also reports PTSD is not well controlled on current medication regimen.  Patient does report compliance with current medication regimen.  Suicide risk factors include: Middle-aged male, divorced, anniversary of death, history of trauma, divorced, family history of suicide, impulsivity, hopelessness, mood disorder diagnosis, insomnia, substance use at time of suicide attempt.   Patient is agreeable for medication change and hospitalization.    Continued Clinical Symptoms:  See H&P    CLINICAL FACTORS:   Severe Anxiety and/or Agitation Depression:   Anhedonia Hopelessness Insomnia Severe More than one psychiatric diagnosis Previous Psychiatric Diagnoses and Treatments   Musculoskeletal: Strength & Muscle Tone: within normal limits Gait & Station: normal Patient leans: N/A  Psychiatric Specialty Exam:  Presentation  General Appearance: Appropriate for Environment; Fairly Groomed  Eye Contact:Good  Speech:Clear and Coherent  Speech Volume:Normal  Handedness: No data recorded  Mood and Affect  Mood:Depressed; Anxious  Affect:Appropriate; Congruent; Depressed   Thought Process  Thought Processes:Coherent; Linear  Descriptions of Associations:Intact  Orientation:Full (Time, Place and Person)  Thought Content:Logical  History of Schizophrenia/Schizoaffective disorder:No  Duration of Psychotic Symptoms:No data recorded Hallucinations:Hallucinations: None  Ideas of Reference:No data recorded Suicidal Thoughts:Suicidal Thoughts: -- (Patient was admitted to the psychiatric unit due to suicide attempt by overdose.  At the time of the interview on the day of admission, the patient denied having suicidal thoughts.)  Homicidal Thoughts:Homicidal Thoughts: No   Sensorium  Memory:Immediate Good; Recent Good; Remote Good  Judgment:Poor  Insight:Fair   Executive Functions  Concentration:Good  Attention Span:Good  Recall:Good  Fund of Knowledge:Good  Language:Good   Psychomotor Activity  Psychomotor Activity:Psychomotor Activity: Normal   Assets  Assets: No data recorded  Sleep  Sleep:Sleep: Poor    Physical Exam: Physical Exam Constitutional:      Appearance: Normal appearance.  HENT:     Head: Normocephalic and atraumatic.  Pulmonary:     Effort: No respiratory distress.  Musculoskeletal:        General:  Normal range of motion.  Neurological:     General: No focal deficit present.     Mental Status: He is alert.  Review of Systems  Constitutional:  Negative for chills and fever.  HENT:  Negative for hearing loss and tinnitus.   Eyes:  Negative for blurred vision and double vision.  Respiratory:  Negative for cough.   Cardiovascular:  Negative for chest pain.  All other systems reviewed and are negative. Blood pressure (!) 135/110, pulse (!) 102, temperature 98.9 F (37.2 C), resp. rate 19, height 5\' 9"  (1.753 m), weight 82.1 kg, SpO2 99 %. Body mass index is 26.73 kg/m.   COGNITIVE FEATURES THAT CONTRIBUTE TO RISK:  None    SUICIDE RISK:   Moderate:  Frequent suicidal ideation with limited intensity, and duration, some specificity in terms of plans, no associated intent, good self-control, limited dysphoria/symptomatology, some risk factors present, and identifiable protective factors, including available and accessible social support.  PLAN OF CARE: ASSESSMENT: Major depressive disorder, severe recurrent without psychosis Generalized anxiety disorder Panic disorder PTSD  PLAN: Safety and Monitoring:  -- Involuntary admission to inpatient psychiatric unit for safety, stabilization and treatment  -- Daily contact with patient to assess and evaluate symptoms and progress in treatment  -- Patient's case to be discussed in multi-disciplinary team meeting  -- Observation Level : q15 minute checks  -- Vital signs:  q12 hours  -- Precautions: suicide, elopement, and assault  2. Psychiatric Diagnoses and Treatment:  -Cross-taper sertraline to venlafaxine.  Patient has partial response for mood symptoms to sertraline.  Patient reports sertraline does not control anxiety, panic attacks, or PTSD symptoms. -Start prazosin 1 mg.  Patient unsure of her present dose.  Can titrate to effect. -Discontinue home Ambien.  This medication patient overdosed on.  We will monitor sleep without  Ambien, and discuss other pharmacologic options as needed in the future. -Discontinue lorazepam PRN. Consider gabapentin in the future for anxiety.   -Continue hydroxyzine as needed for anxiety -Start mirtazapine 15 mg nightly - for insomnia and mood  -CIWA with as needed lorazepam for 2-3 days.  Patient not withdrawing right now from benzodiazepine.  We will monitor. --  The risks/benefits/side-effects/alternatives to this medication were discussed in detail with the patient and time was given for questions. The patient consents to medication trial.   -- Metabolic profile and EKG monitoring obtained while on an atypical antipsychotic (BMI: Lipid Panel: HbgA1c: QTc:)  -- Encouraged patient to participate in unit milieu and in scheduled group therapies   -- Short Term Goals: Ability to identify changes in lifestyle to reduce recurrence of condition will improve, Ability to verbalize feelings will improve, Ability to disclose and discuss suicidal ideas, Ability to demonstrate self-control will improve, Ability to identify and develop effective coping behaviors will improve, Ability to maintain clinical measurements within normal limits will improve, Compliance with prescribed medications will improve, and Ability to identify triggers associated with substance abuse/mental health issues will improve  -- Long Term Goals: Improvement in symptoms so as ready for discharge    3. Medical Issues Being Addressed:   Tobacco Use Disorder  -- Nicotine patch 21mg /24 hours ordered  -- Smoking cessation encouraged  4. Discharge Planning:   -- Social work and case management to assist with discharge planning and identification of hospital follow-up needs prior to discharge  -- Estimated LOS: 5-7 days  -- Discharge Concerns: Need to establish a safety plan; Medication compliance and effectiveness  -- Discharge Goals: Return home with outpatient referrals for mental health follow-up including medication  management/psychotherapy     Total Time Spent in Direct Patient Care:  I personally  spent 60 minutes on the unit in direct patient care. The direct patient care time included face-to-face time with the patient, reviewing the patient's chart, communicating with other professionals, and coordinating care. Greater than 50% of this time was spent in counseling or coordinating care with the patient regarding goals of hospitalization, psycho-education, and discharge planning needs.   Phineas Inches, MD Psychiatrist    I certify that inpatient services furnished can reasonably be expected to improve the patient's condition.   Cristy Hilts, MD 12/07/2020, 2:25 PM

## 2020-12-08 DIAGNOSIS — F332 Major depressive disorder, recurrent severe without psychotic features: Secondary | ICD-10-CM

## 2020-12-08 NOTE — Progress Notes (Signed)
[facility] Group Notes:  (Nursing/MHT/Case Management/Adjunct)  Date:  12/08/2020  Time:  2015  Type of Therapy:   wrap up group  Participation Level:  Active  Participation Quality:  Appropriate, Attentive, Sharing, and Supportive  Affect:  Appropriate  Cognitive:  Alert  Insight:  Improving  Engagement in Group:  Engaged  Modes of Intervention:  Clarification, Education, and Support  Summary of Progress/Problems: Positive thinking and positive change were discussed. Pt reported having a good visit with his dad today an is grateful for his service dog. Pt wants to have a more positive outlook.   Marcille Buffy 12/08/2020, 9:16 PM

## 2020-12-08 NOTE — Group Note (Signed)
LCSW Group Therapy Note 12/08/2020  10:15am-11:15am  Type of Therapy and Topic:  Group Therapy: Anger and Commonalities  Participation Level:  Active   Description of Group: In this group, patients initially identified something that consistently makes them angry.  In doing this, they were able to see they have much in common with other patients.  We discussed possible underlying emotions that lead to this anger, which was explained to be the secondary emotion.  We also discussed cognitions that might lead correctly or incorrectly to underlying feelings that then lead to anger.  Many examples were shared by group members.  Commonalities among group members were pointed out throughout the entirety of group.  Also pointed out numerous times is that the targets of a person's anger also have underlying cognitions and emotions that could explain their reactions/anger.  Therapeutic Goals: Patients will be able to identify one thing that consistently angers them and why. Patients will understand the commonalities they have with others in terms of their anger triggers.   Patients will learn that anger itself is a secondary emotion and will think about possible primary emotions that could influence their anger. Patients will learn that cognitions can be correct or incorrect and often lead to the emotions that then lead to anger.     Summary of Patient Progress:  The patient shared that one of his/her causes of anger is "stupidity."  He listened attentively throughout group without commenting himself.  When called on directly, he stated that when it comes down to it, we are each responsible for our responses so we need to stop giving reasons for other people making [place] angry.  The group in large part was in agreement with him.  He was given positive feedback from CSW.  Therapeutic Modalities:   Cognitive Behavioral Therapy  Lynnell Chad, LCSW

## 2020-12-08 NOTE — Plan of Care (Signed)
Patient was in the dayroom and pleasant on approach. Denying SI/HI/AVH. Complained of anxiety and received Vistaril. Patient went to bad and has been asleep.

## 2020-12-08 NOTE — Progress Notes (Addendum)
[facility] MD Progress Note  12/08/2020 2:40 PM Jim Duke  MRN:  [id number] Subjective:  Patient is a 30 year old male with history of GAD, PTSD, MDD, and grief presents to [facility] from [facility] ED due to suicide attempt via overdose.  Chart Review, 24 hr Events: The patient's chart was reviewed and nursing notes were reviewed. The patient's case was discussed in multidisciplinary team meeting.  Per MAR: - Patient is compliant with scheduled meds. - PRNs: hydroxyzine x2 Per RN notes, no documented behavioral issues and is attending group. Patient slept, 6.75 hours  Today's Interview Patient was seen and assessed with attending Dr. Mason Jim.  Patient states that he was doing better today.  Patient's does not feel like he is depressed.  Patient's only concern is he feels anxious being away from his service dog [place].  Patient patient denies any physical complaints including alcohol withdrawal or tremors or GI upset or headache or nausea vomiting.  Patient states that he slept well last night and felt that the therapy was very beneficial while he was here.  Patient is presently still agreeable to the cross-taper from sertraline to Effexor as well as using Remeron for sleep at night.  Patient states that he only got up twice last night which is fewer times than he has in the past (average 5-6 times a night).  Patient states that he only had 2 panic attacks yesterday.  Patient no longer feels that he is very depressed and is very enthusiastic about leaving.  Patient denies present SI/HI/AVH, paranoia, for strength symptoms, thought insertion, thought broadcasting.   Principal Problem: Major depressive disorder, recurrent episode, severe (HCC) Diagnosis: Principal Problem:   Major depressive disorder, recurrent episode, severe (HCC) Active Problems:   GAD (generalized anxiety disorder)   Panic disorder   PTSD (post-traumatic stress disorder)  Total Time spent with patient:  I personally  spent 25 minutes on the unit in direct patient care. The direct patient care time included face-to-face time with the patient, reviewing the patient's chart, communicating with other professionals, and coordinating care. Greater than 50% of this time was spent in counseling or coordinating care with the patient regarding goals of hospitalization, psycho-education, and discharge planning needs.   Past Psychiatric History: see H&P  Past Medical History:  Past Medical History:  Diagnosis Date   Anxiety    Depression    PTSD (post-traumatic stress disorder)    Seizures (HCC)     Past Surgical History:  Procedure Laterality Date   NOSE SURGERY     Family History:  Family History  Problem Relation Age of Onset   Alcoholism Father    Family Psychiatric  History: see H&P Social History:  Social History   Substance and Sexual Activity  Alcohol Use Yes   Comment: occ     Social History   Substance and Sexual Activity  Drug Use Not Currently    Social History   Socioeconomic History   Marital status: Unknown    Spouse name: Not on file   Number of children: Not on file   Years of education: Not on file   Highest education level: Not on file  Occupational History   Not on file  Tobacco Use   Smoking status: Never   Smokeless tobacco: Never  Vaping Use   Vaping Use: Never used  Substance and Sexual Activity   Alcohol use: Yes    Comment: occ   Drug use: Not Currently   Sexual activity: Not on file  Other Topics Concern   Not on file  Social History Narrative   Not on file   Social Determinants of Health   Financial Resource Strain: Not on file  Food Insecurity: Not on file  Transportation Needs: Not on file  Physical Activity: Not on file  Stress: Not on file  Social Connections: Not on file   Additional Social History:                         Sleep: Good  Appetite:  Good  Current Medications: Current Facility-Administered Medications   Medication Dose Route Frequency Provider Last Rate Last Admin   acetaminophen (TYLENOL) tablet 650 mg  650 mg Oral Q6H PRN Novella Olive, NP       alum & mag hydroxide-simeth (MAALOX/MYLANTA) 200-200-20 MG/5ML suspension 30 mL  30 mL Oral Q4H PRN Novella Olive, NP       hydrOXYzine (ATARAX/VISTARIL) tablet 25 mg  25 mg Oral TID PRN Novella Olive, NP   25 mg at 12/07/20 2119   LORazepam (ATIVAN) tablet 1 mg  1 mg Oral Q6H PRN Massengill, Harrold Donath, MD       magnesium hydroxide (MILK OF MAGNESIA) suspension 30 mL  30 mL Oral Daily PRN Novella Olive, NP       mirtazapine (REMERON) tablet 15 mg  15 mg Oral QHS Novella Olive, NP   15 mg at 12/07/20 2119   prazosin (MINIPRESS) capsule 1 mg  1 mg Oral QHS Massengill, Harrold Donath, MD   1 mg at 12/07/20 2119   [START ON 12/09/2020] sertraline (ZOLOFT) tablet 25 mg  25 mg Oral Daily Massengill, Harrold Donath, MD       venlafaxine XR (EFFEXOR-XR) 24 hr capsule 37.5 mg  37.5 mg Oral Q breakfast Massengill, Harrold Donath, MD   37.5 mg at 12/08/20 0900   [START ON 12/10/2020] venlafaxine XR (EFFEXOR-XR) 24 hr capsule 75 mg  75 mg Oral Once Phineas Inches, MD        Lab Results:  Results for orders placed or performed during the hospital encounter of 12/06/20 (from the past 48 hour(s))  Hemoglobin A1c     Status: None   Collection Time: 12/07/20  6:31 AM  Result Value Ref Range   Hgb A1c MFr Bld 4.9 4.8 - 5.6 %    Comment: (NOTE) Pre diabetes:          5.7%-6.4%  Diabetes:              >6.4%  Glycemic control for   <7.0% adults with diabetes    Mean Plasma Glucose 93.93 mg/dL    Comment: Performed at [facility], [address] [place], [place] [address]  Lipid panel     Status: Abnormal   Collection Time: 12/07/20  6:31 AM  Result Value Ref Range   Cholesterol 243 (H) 0 - 200 mg/dL   Triglycerides 034 <742 mg/dL   HDL 39 (L) >59 mg/dL   Total CHOL/HDL Ratio 6.2 RATIO   VLDL 26 0 - 40 mg/dL   LDL Cholesterol 563 (H) 0 - 99 mg/dL    Comment:         Total Cholesterol/HDL:CHD Risk Coronary Heart Disease Risk Table                     Men   Women  1/2 Average Risk   3.4   3.3  Average Risk  5.0   4.4  2 X Average Risk   9.6   7.1  3 X Average Risk  23.4   11.0        Use the calculated Patient Ratio above and the CHD Risk Table to determine the patient's CHD Risk.        ATP III CLASSIFICATION (LDL):  <100     mg/dL   Optimal  161-096  mg/dL   Near or Above                    Optimal  130-159  mg/dL   Borderline  045-409  mg/dL   High  >811     mg/dL   Very High Performed at [facility], [address], [place], [place] [address]   TSH     Status: None   Collection Time: 12/07/20  6:31 AM  Result Value Ref Range   TSH 2.009 0.350 - 4.500 uIU/mL    Comment: Performed by a 3rd Generation assay with a functional sensitivity of <=0.01 uIU/mL. Performed at [facility], [address] [place], [place] [address]     Blood Alcohol level:  Lab Results  Component Value Date   ETH 113 (H) 12/04/2020   ETH <10 07/14/2018    Metabolic Disorder Labs: Lab Results  Component Value Date   HGBA1C 4.9 12/07/2020   MPG 93.93 12/07/2020   MPG 96.8 07/15/2018   No results found for: PROLACTIN Lab Results  Component Value Date   CHOL 243 (H) 12/07/2020   TRIG 131 12/07/2020   HDL 39 (L) 12/07/2020   CHOLHDL 6.2 12/07/2020   VLDL 26 12/07/2020   LDLCALC 178 (H) 12/07/2020    Physical Findings: AIMS: Facial and Oral Movements Muscles of Facial Expression: None, normal Lips and Perioral Area: None, normal Jaw: None, normal Tongue: None, normal,Extremity Movements Upper (arms, wrists, hands, fingers): None, normal Lower (legs, knees, ankles, toes): None, normal, Trunk Movements Neck, shoulders, hips: None, normal, Overall Severity Severity of abnormal movements (highest score from questions above): None, normal Incapacitation due to abnormal movements: None, normal Patient's  awareness of abnormal movements (rate only patient's report): No Awareness, Dental Status Current problems with teeth and/or dentures?: No Does patient usually wear dentures?: No  CIWA:  CIWA-Ar Total: 2 COWS:     Musculoskeletal: Strength & Muscle Tone: within normal limits Gait & Station: normal Patient leans: N/A  Psychiatric Specialty Exam:  Presentation  General Appearance: Appropriate for Environment; Fairly Groomed  Eye Contact:Good  Speech:Clear and Coherent  Speech Volume:Normal  Handedness: No data recorded  Mood and Affect  Mood: described as improving - appear more euthymic  Affect: polite, calm  Thought Process  Thought Processes:Coherent; Linear  Descriptions of Associations:Intact  Orientation:Full (Time, Place and Person)  Thought Content:Logical - no evidence of psychosis, delusions, or paranoia  History of Schizophrenia/Schizoaffective disorder:No  Duration of Psychotic Symptoms:No data recorded Hallucinations:Hallucinations: None  Ideas of Reference:None  Suicidal Thoughts:Suicidal Thoughts: No  Homicidal Thoughts:Homicidal Thoughts: No   Sensorium  Memory:Immediate Good; Recent Good; Remote Good  Judgment:Intact  Insight:Fair   Executive Functions  Concentration:Good  Attention Span:Good  Recall:Good  Fund of Knowledge:Good  Language:Good   Psychomotor Activity  Psychomotor Activity:Psychomotor Activity: Normal   Assets  Assets:Physical Health; Social Support; Manufacturing systems engineer; Desire for Improvement; Housing   Sleep  Sleep:6.75 hours   Physical Exam Vitals and nursing note reviewed.  Constitutional:      Appearance: Normal appearance. He  is normal weight.  HENT:     Head: Normocephalic and atraumatic.  Pulmonary:     Effort: Pulmonary effort is normal.  Neurological:     General: No focal deficit present.     Mental Status: He is oriented to person, place, and time.   Review of Systems   Respiratory:  Negative for shortness of breath.   Cardiovascular:  Negative for chest pain.  Gastrointestinal:  Negative for abdominal pain, constipation, diarrhea, heartburn, nausea and vomiting.  Neurological:  Negative for headaches.  Blood pressure (!) 143/81, pulse 87, temperature 99.1 F (37.3 C), temperature source Oral, resp. rate 18, height 5\' 9"  (1.753 m), weight 82.1 kg, SpO2 100 %. Body mass index is 26.73 kg/m.   Treatment Plan Summary: Daily contact with patient to assess and evaluate symptoms and progress in treatment and Medication management  ASSESSMENT Patient is a 30 year old male with history of GAD, PTSD, MDD, and grief presents to [facility] from [facility] ED due to suicide attempt via overdose. Patient agreeable to cross taper to Effexor and attempting using Remeron as sleep aid as opposed to [facility].  Patient presently denies any side effects from Effexor or Remeron additions.  Patient states he is sleeping appropriately, eating appropriately and feels his depression is improving.   Psychiatric Problems MDD recurrent severe w/o psychosis Primary Panic Disorder(r/o GAD) PTSD Complicated Bereavement R/o Alcohol Use Disorder -On CIWA protocol for 72 hours (recent scores 0,5,2) -Hydroxyzine 25 mg tid prn for anxiety -Remeron 15 mg for MDD and sleep -Sertraline 50 mg 10/1, 25 mg 10/2, then stop (cross taper to effexor) -Effexor 37.5 for 2 days then begin effexor XR 75 mg 10/3 for panic disorder  -Prazosin 1 mg for nightmares/PTSD. Will likely need to titrate up as well as to manage blood pressure   Medical Problems Received Flu shot   PRNs Maalox for indigestion Milk of Magnesia for constipation Tylenol for pain   3. Safety and Monitoring: Involuntary admission to inpatient psychiatric unit for safety, stabilization and treatment Daily contact with patient to assess and evaluate symptoms and progress in treatment Patient's case to be discussed in  multi-disciplinary team meeting Observation Level : q15 minute checks Vital signs: q12 hours Precautions: suicide, elopement, and assault   4. Discharge Planning: Social work and case management to assist with discharge planning and identification of hospital follow-up needs prior to discharge Discharge Concerns: Need to establish a safety plan; Medication compliance and effectiveness Discharge Goals: Return home with outpatient referrals for mental health follow-up including medication management/psychotherapy  12/2, MD 12/08/2020, 2:40 PM

## 2020-12-08 NOTE — [facility] Group Notes (Signed)
[facility] Group Notes:  (Nursing/MHT/Case Management/Adjunct)  Date:  12/08/2020  Time:  8:30am  Type of Therapy:  Group Therapy  Participation Level:  Active  Participation Quality:  Appropriate  Affect:  Appropriate  Cognitive:  Appropriate  Insight:  Appropriate  Engagement in Group:  Engaged  Modes of Intervention:  Discussion  Summary of Progress/Problems:Patient discussed the need to be remain calm. To help him reach his goal he will work at remain optimist.   Reymundo Poll 12/08/2020, 9:19 AM

## 2020-12-08 NOTE — [facility] Group Notes (Signed)
[facility] Group Notes:  (Nursing/MHT/Case Management/Adjunct)  Date:  12/08/2020  Time:  2:20 am  Type of Therapy:  Psychoeducational Skills  Participation Level:  Active  Participation Quality:  Appropriate  Affect:  Appropriate  Cognitive:  Appropriate  Insight:  Appropriate  Engagement in Group:  Engaged  Modes of Intervention:  Education  Summary of Progress/Problems:Anger Management. Improving.  Reymundo Poll 12/08/2020, 3:32 PM

## 2020-12-08 NOTE — Progress Notes (Signed)
Pt attended and participated in scheduled groups. Went off unit for meals and returned without issues. Report he slept well last night with good appetite, normal energy and good concentration level. Rates his anxiety 5/10, depression and hopelessness both 2/10. Per pt "This is me, I'm always anxious, its nothing major". Pt's goal for today is "Staying calm and being optimistic about everything". Denies SI, HI, AVH and pain when assessed. Denies active withdrawals symptoms at this time. Safety checks maintained at Q 15 minutes intervals without self harm gestures or outburst. Support, encouragement and reassurance provided to pt this shift. Tolerates all meals well. Compliant with medications without adverse drug reactions.

## 2020-12-09 DIAGNOSIS — F332 Major depressive disorder, recurrent severe without psychotic features: Secondary | ICD-10-CM | POA: Diagnosis not present

## 2020-12-09 MED ORDER — MIRTAZAPINE 15 MG PO TABS: 15.0000 mg | ORAL_TABLET | Freq: Every day | ORAL | 0 refills | Status: AC

## 2020-12-09 MED ORDER — VENLAFAXINE HCL ER 75 MG PO CP24: 75.0000 mg | ORAL_CAPSULE | Freq: Every day | ORAL | 0 refills | Status: AC

## 2020-12-09 MED ORDER — PRAZOSIN HCL 2 MG PO CAPS: 2.0000 mg | ORAL_CAPSULE | Freq: Every day | ORAL | 0 refills | Status: AC

## 2020-12-09 MED ORDER — VENLAFAXINE HCL ER 75 MG PO CP24
75.0000 mg | ORAL_CAPSULE | Freq: Every day | ORAL | Status: DC
  Administered 2020-12-10: 75 mg via ORAL
  Filled 2020-12-09 (×2): qty 1

## 2020-12-09 MED ORDER — PRAZOSIN HCL 2 MG PO CAPS
2.0000 mg | ORAL_CAPSULE | Freq: Every day | ORAL | Status: DC
  Administered 2020-12-09: 2 mg via ORAL
  Filled 2020-12-09 (×2): qty 1

## 2020-12-09 NOTE — Group Note (Signed)
[facility] LCSW Group Therapy Note  Date/Time:  12/09/2020 10:15am-11:15am  Type of Therapy and Topic:  Group Therapy:  Healthy and Unhealthy Supports  Participation Level:  Active   Description of Group:  Patients in this group were introduced to the idea of adding a variety of healthy supports to address the various needs in their lives.Patients discussed what additional healthy supports could be helpful in their recovery and wellness after discharge in order to prevent future hospitalizations.   An emphasis was placed on using counselor, doctor, therapy groups, 12-step groups, and problem-specific support groups to expand supports.    Therapeutic Goals:   1)  discuss importance of adding supports to stay well once out of the hospital  2)  compare healthy versus unhealthy supports and identify some examples of each  3)  generate ideas and descriptions of healthy supports that can be added  4)  offer mutual support about how to address unhealthy supports  5)  encourage active participation in and adherence to discharge plan    Summary of Patient Progress:  The patient stated that current healthy supports in his life are his circle of friends while current unhealthy supports include himself when he chooses to be unhealthy.  The patient expressed that the conclusion of this topic was obvious, that it is up to each individual to make right or wrong choices.  CSW explained to the group that while each person makes a choice, there is something valuable about deciding what needs are present and how to get those met in a healthy fashion, plus what to do about the unheathy persons in our lives.  The patient was called out to see a provider, and when he returned he did not talk further.   Therapeutic Modalities:   Motivational Interviewing Brief Solution-Focused Therapy  Selmer Dominion, LCSW

## 2020-12-09 NOTE — Progress Notes (Signed)
[facility] Group Notes:  (Nursing/MHT/Case Management/Adjunct)  Date:  12/09/2020  Time:  2015  Type of Therapy:   wrap up group  Participation Level:  Active  Participation Quality:  Appropriate, Attentive, Sharing, and Supportive  Affect:  Appropriate  Cognitive:  Alert  Insight:  Improving  Engagement in Group:  Engaged  Modes of Intervention:  Clarification, Education, and Support  Summary of Progress/Problems: Positive thinking and self-care were discussed.   Marcille Buffy 12/09/2020, 8:55 PM

## 2020-12-09 NOTE — Progress Notes (Addendum)
[facility] MD Progress Note  12/09/2020 12:33 PM ASIM GERSTEN  MRN:  [id number] Subjective:  Patient is a 30 year old male with history of GAD, PTSD, MDD, and grief presents to [facility] from [facility] ED due to suicide attempt via overdose.  Chart Review, 24 hr Events: The patient's chart was reviewed and nursing notes were reviewed. The patient's case was discussed in multidisciplinary team meeting.  Per MAR: - Patient is compliant with scheduled meds. - PRNs: none Per RN notes, no documented behavioral issues and is attending group. Patient slept, 6.5 hours  Today's Interview Patient was seen and assessed with attending Dr. Mason Jim.  Patient states that he was doing better today, denying depressive symptoms.  Patient's only concern is he feels anxious being away from his service dog and being on the unit; he rates his anxiety as a 9 out of 10, with 10 being the worst.  Patient denies any physical complaints of alcohol withdrawal including tremors, GI upset,  headache, nausea, or vomiting.  Patient states that he slept well last night and is ready for discharge.  He has follow-up with psychiatry, therapy primary care at the [facility] and has a strong support system in his family.  He states that he will be living with his dad until he is able to find an apartment on his own.  Patient denies side effects from the cross taper from sertraline to Effexor, and has been sleeping well on the Remeron as well.  Patient's elevated blood pressures were discussed with him, and he was agreeable to increase his nighttime prazosin.  Patient denies present SI/HI/AVH, paranoia, first rank symptoms, thought insertion, and thought broadcasting.   Principal Problem: Major depressive disorder, recurrent episode, severe (HCC) Diagnosis: Principal Problem:   Major depressive disorder, recurrent episode, severe (HCC) Active Problems:   GAD (generalized anxiety disorder)   Panic disorder   PTSD (post-traumatic stress  disorder)  Total Time spent with patient:  I personally spent 25 minutes on the unit in direct patient care. The direct patient care time included face-to-face time with the patient, reviewing the patient's chart, communicating with other professionals, and coordinating care. Greater than 50% of this time was spent in counseling or coordinating care with the patient regarding goals of hospitalization, psycho-education, and discharge planning needs.   Past Psychiatric History: see H&P  Past Medical History:  Past Medical History:  Diagnosis Date   Anxiety    Depression    PTSD (post-traumatic stress disorder)    Seizures (HCC)     Past Surgical History:  Procedure Laterality Date   NOSE SURGERY     Family History:  Family History  Problem Relation Age of Onset   Alcoholism Father    Family Psychiatric  History: see H&P Social History:  Social History   Substance and Sexual Activity  Alcohol Use Yes   Comment: occ     Social History   Substance and Sexual Activity  Drug Use Not Currently    Social History   Socioeconomic History   Marital status: Unknown    Spouse name: Not on file   Number of children: Not on file   Years of education: Not on file   Highest education level: Not on file  Occupational History   Not on file  Tobacco Use   Smoking status: Never   Smokeless tobacco: Never  Vaping Use   Vaping Use: Never used  Substance and Sexual Activity   Alcohol use: Yes    Comment: occ  Drug use: Not Currently   Sexual activity: Not on file  Other Topics Concern   Not on file  Social History Narrative   Not on file   Social Determinants of Health   Financial Resource Strain: Not on file  Food Insecurity: Not on file  Transportation Needs: Not on file  Physical Activity: Not on file  Stress: Not on file  Social Connections: Not on file   Additional Social History:       Sleep: Good  Appetite:  Good  Current Medications: Current  Facility-Administered Medications  Medication Dose Route Frequency Provider Last Rate Last Admin   acetaminophen (TYLENOL) tablet 650 mg  650 mg Oral Q6H PRN Novella Olive, NP       alum & mag hydroxide-simeth (MAALOX/MYLANTA) 200-200-20 MG/5ML suspension 30 mL  30 mL Oral Q4H PRN Novella Olive, NP       hydrOXYzine (ATARAX/VISTARIL) tablet 25 mg  25 mg Oral TID PRN Novella Olive, NP   25 mg at 12/07/20 2119   LORazepam (ATIVAN) tablet 1 mg  1 mg Oral Q6H PRN Massengill, Harrold Donath, MD       magnesium hydroxide (MILK OF MAGNESIA) suspension 30 mL  30 mL Oral Daily PRN Novella Olive, NP       mirtazapine (REMERON) tablet 15 mg  15 mg Oral QHS Novella Olive, NP   15 mg at 12/08/20 2045   prazosin (MINIPRESS) capsule 2 mg  2 mg Oral QHS Lamar Sprinkles, MD       [START ON 12/10/2020] venlafaxine XR (EFFEXOR-XR) 24 hr capsule 75 mg  75 mg Oral Daily Lamar Sprinkles, MD        Lab Results:  No results found for this or any previous visit (from the past 48 hour(s)).   Blood Alcohol level:  Lab Results  Component Value Date   ETH 113 (H) 12/04/2020   ETH <10 07/14/2018    Metabolic Disorder Labs: Lab Results  Component Value Date   HGBA1C 4.9 12/07/2020   MPG 93.93 12/07/2020   MPG 96.8 07/15/2018   No results found for: PROLACTIN Lab Results  Component Value Date   CHOL 243 (H) 12/07/2020   TRIG 131 12/07/2020   HDL 39 (L) 12/07/2020   CHOLHDL 6.2 12/07/2020   VLDL 26 12/07/2020   LDLCALC 178 (H) 12/07/2020    Physical Findings: AIMS: Facial and Oral Movements Muscles of Facial Expression: None, normal Lips and Perioral Area: None, normal Jaw: None, normal Tongue: None, normal,Extremity Movements Upper (arms, wrists, hands, fingers): None, normal Lower (legs, knees, ankles, toes): None, normal, Trunk Movements Neck, shoulders, hips: None, normal, Overall Severity Severity of abnormal movements (highest score from questions above): None, normal Incapacitation due to  abnormal movements: None, normal Patient's awareness of abnormal movements (rate only patient's report): No Awareness, Dental Status Current problems with teeth and/or dentures?: No Does patient usually wear dentures?: No  CIWA:  CIWA-Ar Total: 2  Musculoskeletal: Strength & Muscle Tone: within normal limits Gait & Station: normal Patient leans: N/A  Psychiatric Specialty Exam:  Presentation  General Appearance: Appropriate for Environment; Well Groomed  Eye Contact:Good  Speech:Clear and Coherent; Normal Rate  Speech Volume:Normal  Handedness: No data recorded  Mood and Affect  Mood: described as improving but anxious about leaving - appear more euthymic  Affect: polite, calm  Thought Process  Thought Processes:Coherent; Linear  Descriptions of Associations:Intact  Orientation:Full (Time, Place and Person)  Thought Content:Logical - no evidence of psychosis,  delusions, or paranoia  History of Schizophrenia/Schizoaffective disorder:No  Duration of Psychotic Symptoms:No data recorded Hallucinations:Hallucinations: None  Ideas of Reference:None  Suicidal Thoughts:Suicidal Thoughts: No  Homicidal Thoughts:Homicidal Thoughts: No   Sensorium  Memory:Immediate Good; Recent Good; Remote Good  Judgment:Good  Insight:Good   Executive Functions  Concentration:Good  Attention Span:Good  Recall:Good  Fund of Knowledge:Good  Language:Good   Psychomotor Activity  Psychomotor Activity:Psychomotor Activity: Normal   Assets  Assets:Communication Skills; Desire for Improvement; Physical Health; Resilience; Social Support   Sleep  Sleep:6.5 hours   Physical Exam Vitals and nursing note reviewed.  Constitutional:      Appearance: Normal appearance. He is normal weight.     Comments: Tattoos present in scalp, on face, and on all other exposed areas of his body.  HENT:     Head: Normocephalic and atraumatic.  Pulmonary:     Effort: Pulmonary effort  is normal.  Neurological:     General: No focal deficit present.     Mental Status: He is oriented to person, place, and time.   Review of Systems  Respiratory:  Negative for shortness of breath.   Cardiovascular:  Negative for chest pain.  Gastrointestinal:  Negative for abdominal pain, constipation, diarrhea, heartburn, nausea and vomiting.  Neurological:  Negative for headaches.  Blood pressure (!) 135/94, pulse 76, temperature 98.4 F (36.9 C), temperature source Oral, resp. rate 18, height 5\' 9"  (1.753 m), weight 82.1 kg, SpO2 99 %. Body mass index is 26.73 kg/m.   Treatment Plan Summary: Daily contact with patient to assess and evaluate symptoms and progress in treatment and Medication management  ASSESSMENT Patient is a 30 year old male with history of GAD, PTSD, MDD, and grief presents to [facility] from [facility] ED due to suicide attempt via overdose. Patient has tolerated cross taper to Effexor well and has responded well to Remeron as sleep aid.  Patient presently denies any side effects from Effexor or Remeron additions.  Patient states he is sleeping appropriately, eating appropriately and feels his depression is improved.   Psychiatric Problems MDD recurrent severe w/o psychosis Primary Panic Disorder(r/o GAD) PTSD Complicated bereavement R/o Alcohol Use Disorder -On CIWA protocol for 72 hours (recent scores 1,0,2) -Hydroxyzine 25 mg tid prn for anxiety -Remeron 15 mg for MDD and sleep -Sertraline 50 mg 10/1, 25 mg 10/2, then stop (cross taper to effexor) -Effexor 37.5 for 2 days then begin effexor XR 75 mg 10/3 for panic disorder  -Increase prazosin to 2 mg for nightmares/PTSD.  Titrating up to manage blood pressure; patient agreeable.   Medical Problems Received Flu shot   PRNs Maalox for indigestion Milk of Magnesia for constipation Tylenol for pain   3. Safety and Monitoring: Involuntary admission to inpatient psychiatric unit for safety, stabilization and  treatment Daily contact with patient to assess and evaluate symptoms and progress in treatment Patient's case to be discussed in multi-disciplinary team meeting Observation Level : q15 minute checks Vital signs: q12 hours Precautions: suicide, elopement, and assault   4. Discharge Planning: Social work and case management to assist with discharge planning and identification of hospital follow-up needs prior to discharge Discharge Concerns: Need to establish a safety plan; Medication compliance and effectiveness Discharge Goals: Return home with outpatient referrals for mental health follow-up including medication management/psychotherapy  12/2, MD 12/09/2020, 12:33 PM

## 2020-12-09 NOTE — [facility] Group Notes (Signed)
[facility] Group Notes:  (Nursing/MHT/Case Management/Adjunct)  Date:  12/09/2020  Time:  8:40 am  Type of Therapy:  Group Therapy  Participation Level:  Active  Participation Quality:  Appropriate  Affect:  Appropriate  Cognitive:  Appropriate  Insight:  Appropriate  Engagement in Group:  Engaged  Modes of Intervention:  Discussion  Summary of Progress/Problems:Continues to use coping skills to remain calm during times of stress.  Reymundo Poll 12/09/2020, 11:54 AM

## 2020-12-09 NOTE — Progress Notes (Signed)
     12/09/20 2050  Vital Signs  Pulse Rate 96  BP (!) 159/102  BP Location Right Arm  BP Method Automatic  Patient Position (if appropriate) Standing  Oxygen Therapy  SpO2 98 %     12/09/20 2206  Vital Signs  Pulse Rate (!) 105  BP (!) 152/105  BP Location Right Arm  BP Method Automatic  Patient Position (if appropriate) Standing  Oxygen Therapy  SpO2 (!) 68 %     12/09/20 2209  Vital Signs  Pulse Rate (!) 108  Oxygen Therapy  SpO2 98 %    Administered Prazosin per MAR Hydrated with 1 cup of Gatorade Pt had no complaints of headache or dizziness Recheck BP, reported to provider in person Recheck oxygen sats = 98% Per provider, will continue to monitor.  Recheck BP in the am.

## 2020-12-09 NOTE — Discharge Summary (Addendum)
Physician Discharge Summary Note  Patient:  Jim Duke is an 30 y.o., male MRN:  [id number] DOB:  [birth year] Patient phone:  [phone] (home)  Patient address:   [address] [place] [address],  Total Time spent with patient: 30 minutes  Date of Admission:  12/06/2020 Date of Discharge: 12/10/2020  Reason for Admission:  Per H&P- Patient is a 30 year old male with history of GAD, PTSD, MDD, and grief presents to [facility] from [facility] ED due to suicide attempt via overdose. On chart review, this is patient's 2nd psychiatric hospitalization. Patient has been to [facility] last year with similar presentation. Patient is normally seen at the [facility] for medications management and health assessments.    Pertinent labs include UDS positive for benzodiazapene, negative COVID PCR, alcohol level 113, hgb 17.6, wnl a1c, lipid panel wnl, TSH wnl.   On assessment, patient said he had been particularly emotional this past week since it was his deceased daughter's birthday. Patient has been struggling with coping skills to appropriately handle significant events related to her daughter including her birthday and the day she died. Pt reports having drank alcohol prior to overdose.  Patient states he felt ashamed of his suicide attempt due to feeling that he would not see his daughter in heaven if he had died and gone to hell. Pt reports previous admission to psychiatric unit, around this same time of year, due to suicide ideations.    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 Patient states he is estranged from siblings as he feels they and his ex-wife had been complicit to death of daughter. He reports flashbacks, night mares, avoidence symptoms and negative mood due to traumatic exposures.  Patient also states he has active nightmares regarding time spent in [employer]. Patient has been on  prazosin but is unable to specify the dosage. Patient states he still has persistent nightmares while on prazosin. Patient also states he sleeps 3-4 hours a day waking up multiple times per day. Patient states he has adjusted to it and "it is just the way it is". Patient states he takes [place] that sometimes works but not to great effect.   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 Patient is now 100% on disability for PTSD, OSA and arthritis. Patient is looking for a place near father as father's health is poor. Patient mentions also that he has been to a 31 day intensive grief therapy that greatly benefited him.   Patient also has a history of depression that patient states primarily stems from death of daughter. Patient states he has depressed mood, some anhedonia, felt poor energy, poor sleep, depressed mood, guilt. Patient has been on sertraline 100 mg qd for this. Patient states it appropriately manages his depression but it does not help greatly with his anxiety or panic attacks.   Pt reports high level of anxiety, worry, feeling on edge, impaired concentration, and poor sleep. He reports that anxiety symptoms are not currently well managed with current mediations.  Patient states he has panic attacks that regularly occur 4-5 times a day. Patient states that his therapy dog has been of great help with this as well as for his anxiety. Patient states his panic attacks last 3-5 minutes long and he describes having tunnel vision. Patient states ativan does not help with his panic attacks. Patient also states he is at times fearful of  leaving the house even to get food because he is fearful of having a panic attack outside of the hospital.    Patient reports less suicidal thoughts since overdose. Patient feels anxious regarding his therapy dog that is at father's house. Patient states he also feels ashamed regarding what he had done that led him to the ED. Patient has a strong desire to leave  hospital to see dog but is agreeable to continued admission for more appropriate medication management of his anxiety and panic attacks.     Principal Problem: Major depressive disorder, recurrent episode, severe (HCC) Discharge Diagnoses: Principal Problem:   Major depressive disorder, recurrent episode, severe (HCC) Active Problems:   GAD (generalized anxiety disorder)   Panic disorder   PTSD (post-traumatic stress disorder)   Past Psychiatric History: MDD, PTSD,GAD. H/o psychiatric hospitalizations for suicide ideation.  Psych med hx: Sertraline - current, not helpful for anxiety, ptsd, panic attacks Lorazepam - current, not helpful for aborting panic attacks Ambien - current, not helpful for treatment of insomnia.  Trazodone - previous, not helpful for insomnia Mirtazapine - previous, unsure if helpful for insomnia   Past Medical History:  Past Medical History:  Diagnosis Date   Anxiety    Depression    PTSD (post-traumatic stress disorder)    Seizures (HCC)     Past Surgical History:  Procedure Laterality Date   NOSE SURGERY     Family History:  Family History  Problem Relation Age of Onset   Alcoholism Father    Family Psychiatric  History: None Social History:  Marital status: Single Are you sexually active?: Yes What is your sexual orientation?: Heterosexual Has your sexual activity been affected by drugs, alcohol, medication, or emotional stress?: No Does patient have children?: Yes How many children?: 1 How is patient's relationship with their children?: Pt reports having 1 daughter who passed away in [id number] at 63 years old Will be living with dad upon discharge Social History   Substance and Sexual Activity  Alcohol Use Yes   Comment: occ     Social History   Substance and Sexual Activity  Drug Use Not Currently    Social History   Socioeconomic History   Marital status: Unknown    Spouse name: Not on file   Number of children: Not on file    Years of education: Not on file   Highest education level: Not on file  Occupational History   Not on file  Tobacco Use   Smoking status: Never   Smokeless tobacco: Never  Vaping Use   Vaping Use: Never used  Substance and Sexual Activity   Alcohol use: Yes    Comment: occ   Drug use: Not Currently   Sexual activity: Not on file  Other Topics Concern   Not on file  Social History Narrative   Not on file   Social Determinants of Health   Financial Resource Strain: Not on file  Food Insecurity: Not on file  Transportation Needs: Not on file  Physical Activity: Not on file  Stress: Not on file  Social Connections: Not on file    Hospital Course:  After the above admission evaluation, Chet's presenting symptoms were noted. He was recommended for treatment of depression, anxiety, insomnia and PTSD. The medication regimen targeting those presenting symptoms were discussed with him & initiated with his consent: His home medications of Zoloft 100 mg and Ambien 10 mg were ineffective for his Anxiety/PTSD and insomnia, respectively, so he was cross-tapered and  titrated up to Effexor 75 mg for anxiety and switched to Remeron 15 mg for insomnia and Prazosin 1 mg (titrated up to 2 mg because of elevated BP) for nightmares.  His UDS on arrival to the ED was positive for BZD, BAL 113, however, he did not develop any alcohol withdrawal symptoms & did not receive alcohol detoxification treatments. He was placed on CIWA protocol for potential BZD withdrawal sx.  He was however medicated, stabilized & discharged on the medications as listed on his discharge medication lists below. Besides the mood stabilization treatments, Antonis was also enrolled & participated in the group counseling sessions being offered & held on this unit. He learned coping skills. He presented no other significant pre-existing medical issues that required treatment. He tolerated his treatment regimen without any adverse  effects or reactions reported.   During the course of His hospitalization, the 15-minute checks were adequate to ensure patient's safety. Wilman did not display any dangerous, violent or suicidal behavior on the unit.  He interacted with patients & staff appropriately, participated appropriately in the group sessions/therapies. His medications were addressed & adjusted to meet his needs. He was recommended for outpatient follow-up care & medication management upon discharge to assure continuity of care & mood stability.  At the time of discharge patient is not reporting any acute suicidal/homicidal ideations. He feels more confident about his self-care & in managing his mental health. He currently denies any new issues or concerns. Education and supportive counseling provided throughout his hospital stay & upon discharge.   Today upon his discharge evaluation with the attending psychiatrist, Taggart shares he is doing well. He denies any other specific concerns. He is sleeping well with the aid of his medication regimen. His appetite is good. He denies other physical complaints. He denies AH/VH, delusional thoughts or paranoia. He does not appear to be responding to any internal stimuli. He feels that his medications have been helpful & is in agreement to continue his current treatment regimen as recommended. He was able to engage in safety planning including plan to return to [facility] or contact emergency services if he feels unable to maintain his own safety or the safety of others. Pt had no further questions, comments, or concerns. He left [facility] with all personal belongings in no apparent distress. Transportation per dad, Bogdan Vivona.    Physical Findings: AIMS: Facial and Oral Movements Muscles of Facial Expression: None, normal Lips and Perioral Area: None, normal Jaw: None, normal Tongue: None, normal,Extremity Movements Upper (arms, wrists, hands, fingers): None, normal Lower (legs, knees, ankles,  toes): None, normal, Trunk Movements Neck, shoulders, hips: None, normal, Overall Severity Severity of abnormal movements (highest score from questions above): None, normal Incapacitation due to abnormal movements: None, normal Patient's awareness of abnormal movements (rate only patient's report): No Awareness, Dental Status Current problems with teeth and/or dentures?: No Does patient usually wear dentures?: No  CIWA:  CIWA-Ar Total: 2   Musculoskeletal: Strength & Muscle Tone: within normal limits Gait & Station: normal, steady Patient leans: N/A   Psychiatric Specialty Exam:  Presentation  General Appearance: Appropriate for Environment; Casual; Fairly Groomed  Eye Contact:Good  Speech:Normal Rate  Speech Volume:Normal  Handedness: No data recorded  Mood and Affect  Mood:Euthymic  Affect:Appropriate; Congruent   Thought Process  Thought Processes:Coherent; Linear  Descriptions of Associations:Intact  Orientation:Full (Time, Place and Person)  Thought Content:Logical  History of Schizophrenia/Schizoaffective disorder:No  Duration of Psychotic Symptoms:No data recorded Hallucinations:Hallucinations: None  Ideas of Reference:None  Suicidal Thoughts:Suicidal Thoughts: No  Homicidal Thoughts:Homicidal Thoughts: No   Sensorium  Memory:Immediate Good; Recent Good; Remote Good  Judgment:Fair  Insight:Fair   Executive Functions  Concentration:Good  Attention Span:Good  Recall:Good  Fund of Knowledge:Good  Language:Good   Psychomotor Activity  Psychomotor Activity:Psychomotor Activity: Normal   Assets  Assets:Communication Skills; Desire for Improvement; Physical Health; Resilience; Social Support   Sleep  Sleep:Sleep: Good    Physical Exam: Physical Exam Vitals and nursing note reviewed.  Constitutional:      General: He is not in acute distress.    Appearance: He is not toxic-appearing.     Comments: Tattoos on face, scalp,  and all exposed areas of the body  HENT:     Head: Normocephalic and atraumatic.  Pulmonary:     Effort: Pulmonary effort is normal.  Musculoskeletal:        General: Normal range of motion.  Neurological:     General: No focal deficit present.     Mental Status: He is alert and oriented to person, place, and time.     Gait: Gait normal.   Review of Systems  Respiratory:  Negative for shortness of breath.   Cardiovascular:  Negative for chest pain.  Gastrointestinal:  Negative for abdominal pain, constipation, diarrhea, nausea and vomiting.  Genitourinary: Negative.   Neurological:  Negative for tremors and headaches.  Blood pressure 133/84, pulse 94, temperature (!) 97.4 F (36.3 C), temperature source Oral, resp. rate 18, height 5\' 9"  (1.753 m), weight 82.1 kg, SpO2 99 %. Body mass index is 26.73 kg/m.   Social History   Tobacco Use  Smoking Status Never  Smokeless Tobacco Never   Tobacco Cessation:  N/A, patient does not currently use tobacco products   Blood Alcohol level:  Lab Results  Component Value Date   ETH 113 (H) 12/04/2020   ETH <10 07/14/2018    Metabolic Disorder Labs:  Lab Results  Component Value Date   HGBA1C 4.9 12/07/2020   MPG 93.93 12/07/2020   MPG 96.8 07/15/2018   No results found for: PROLACTIN Lab Results  Component Value Date   CHOL 243 (H) 12/07/2020   TRIG 131 12/07/2020   HDL 39 (L) 12/07/2020   CHOLHDL 6.2 12/07/2020   VLDL 26 12/07/2020   LDLCALC 178 (H) 12/07/2020    See Psychiatric Specialty Exam and Suicide Risk Assessment completed by Attending Physician prior to discharge.  Discharge destination:  Home  Is patient on multiple antipsychotic therapies at discharge:  No   Has Patient had three or more failed trials of antipsychotic monotherapy by history:  No  Recommended Plan for Multiple Antipsychotic Therapies: NA   Allergies as of 12/10/2020   No Known Allergies      Medication List     STOP taking these  medications    sertraline 100 MG tablet Commonly known as: ZOLOFT   zolpidem 10 MG tablet Commonly known as: AMBIEN       TAKE these medications      Indication  LORazepam 1 MG tablet Commonly known as: ATIVAN Take 1 mg by mouth in the morning, at noon, and at bedtime.  Indication: Alcohol Withdrawal Syndrome   mirtazapine 15 MG tablet Commonly known as: REMERON Take 1 tablet (15 mg total) by mouth at bedtime.  Indication: Major Depressive Disorder   prazosin 2 MG capsule Commonly known as: MINIPRESS Take 1 capsule (2 mg total) by mouth at bedtime.  Indication: Frightening Dreams   venlafaxine XR 75 MG 24  hr capsule Commonly known as: EFFEXOR-XR Take 1 capsule (75 mg total) by mouth daily.  Indication: Major Depressive Disorder, Posttraumatic Stress Disorder        Follow-up Information     Clinic, [facility] Go on 12/20/2020.   Why: You have a hospital discharge appointment for therapy and medication management services on 12/20/20 at 9:30 am.  This appointment will be held in person. Contact information: [address] [facility] [place] [address] [phone]                 Follow-up recommendations:  Activity:  Normal, as tolerated Diet:  Low cholesterol  Comments:  Prescriptions were given at discharge.  Patient is agreeable with the discharge  plan.  He was given an opportunity to ask questions.  He appears to feel comfortable with discharge and denies any current suicidal or homicidal thoughts.    Patient is instructed prior to discharge to: Take all medications as prescribed by his mental healthcare provider. Report any adverse effects and/or reactions from the medicines to his outpatient provider promptly. Patient has been instructed & cautioned: To not engage in alcohol and or illegal drug use while on prescription medicines.  In the event of worsening symptoms, patient is instructed to call the crisis hotline at 988, 911  and or go to the nearest ED for appropriate evaluation and treatment of symptoms. To follow-up with his primary care provider for your other medical issues, concerns and or health care needs.   Signed: Lamar Sprinkles, MD 12/11/2020, 9:12 PM

## 2020-12-09 NOTE — Progress Notes (Signed)
Pt presents anxious, restless but is pleasant on interactions with logical speech, fair eye contact, bright affect and is ambulatory in milieu with a steady gait.  Remains safe on and off unit. Cooperative with unit routines including scheduled groups and was engaged. Denies SI, HI, AVH and pain when assessed at this time. Compliant with scheduled medications without side effects. Reports he slept fairly last night due to snores from his room mate. States his appetite is good with  normal mood and good concentration level. Tolerates all meals and fluids well.  Verbal education provided on all medications and effects monitored. Q 15 minutes safety checks maintained without self harm gestures or outburst to note at this time. Pt BP remains elevated with slight improvement in diastolic when rechecked this afternoon. Treatment team is aware. EKG done as ordered. Support, encouragement and reassurance provided to pt throughout this shift.  Pt remains safe on and off unit. Denies withdrawal symptoms / concerns at this time

## 2020-12-09 NOTE — [facility] Group Notes (Signed)
[facility] Group Notes:  (Nursing/MHT/Case Management/Adjunct)  Date:  12/09/2020  Time:  2:30 pm  ype of Therapy:  Psychoeducational Skills  Participation Level:  Active  Participation Quality:  Appropriate  Affect:  Appropriate  Cognitive:  Appropriate  Insight:  Appropriate  Engagement in Group:  Engaged  Modes of Intervention:  Education  Summary of Progress/Problems: Making progress on feelings of grief about daughters's death. Provided a work sheet on Self-Encouragement and reminded him that his happiness and success depends on his ability to encourage himself.     Jim Duke 12/09/2020, 4:21 PM

## 2020-12-09 NOTE — Plan of Care (Signed)
Patient spent his evening in the dayroom with peers. Pleasant and cooperative. Attended group and was supportive to peers. Received bedtime medication, had a snack and went to bed. Patient slept throughout the night without any difficulty.

## 2020-12-09 NOTE — [facility] Suicide Risk Assessment (Addendum)
[facility] INPATIENT:  Family/Significant Other Suicide Prevention Education  Suicide Prevention Education:  Education Completed; Koden Hunzeker @ [phone],  (Father) has been identified by the patient as the family member/significant other with whom the patient will be residing, and identified as the person(s) who will aid the patient in the event of a mental health crisis (suicidal ideations/suicide attempt).  With written consent from the patient, the family member/significant other has been provided the following suicide prevention education, prior to the and/or following the discharge of the patient.  The suicide prevention education provided includes the following: Suicide risk factors Suicide prevention and interventions National Suicide Hotline telephone number [facility] assessment telephone number [facility] Emergency Assistance 911 [facility] and/or Residential Mobile Crisis Unit telephone number  Request made of family/significant other to: Remove weapons (e.g., guns, rifles, knives), all items previously/currently identified as safety concern.   Remove drugs/medications (over-the-counter, prescriptions, illicit drugs), all items previously/currently identified as a safety concern.  The family member/significant other verbalizes understanding of the suicide prevention education information provided.  The family member/significant other agrees to remove the items of safety concern listed above.  Father reports patient's mood was pleasant yesterday when he visited. During call he states "all his problems stem from issues concerning custody of his daughter," claims the mother of his daughter kidnapped the child and fled to [place]. Reports he now has a service dog which helps with feelings of grief. Father did not go into further detail.   Father states the patient is to live with him for the time being where he agrees to monitor the patient and assure he attends  outpatient appointments. States there are no firearms at the home and agrees to secure all medications.   Corky Crafts 12/09/2020, 3:54 PM

## 2020-12-10 DIAGNOSIS — F332 Major depressive disorder, recurrent severe without psychotic features: Principal | ICD-10-CM

## 2020-12-10 NOTE — [facility] Suicide Risk Assessment (Signed)
[facility] Discharge Suicide Risk Assessment   Principal Problem: Major depressive disorder, recurrent episode, severe (HCC) Discharge Diagnoses: Principal Problem:   Major depressive disorder, recurrent episode, severe (HCC) Active Problems:   GAD (generalized anxiety disorder)   Panic disorder   PTSD (post-traumatic stress disorder)   Total Time Spent in Direct Patient Care:  I personally spent 35 minutes on the unit in direct patient care. The direct patient care time included face-to-face time with the patient, reviewing the patient's chart, communicating with other professionals, and coordinating care. Greater than 50% of this time was spent in counseling or coordinating care with the patient regarding goals of hospitalization, psycho-education, and discharge planning needs.   Subjective: A 30 year old male with a psychiatric history of major depressive disorder, PTSD, GAD, panic disorder, was admitted to the psychiatric unit after suicide attempt by overdose.  During hospitalization, medications were changed, supportive psychotherapy was provided, and patient was encouraged to participate in group therapy and unit milieu.   Medication adjustments during hospitalization: Zoloft cross taper to Effexor.  Increase prazosin, DC Ativan, DC Ambien.  Start mirtazapine. Patient reports medication changes were beneficial, and that he feels less depressed, less anxious, sleeping better, and is having less anxious ruminations about the death of his daughter. Patient was seen and interviewed today with the resident, and reports that his mood is better and that he is less anxious.  Denies depressed mood and anhedonia.  Reports sleep is better.  Denies feeling hopeless, helpless or worthless.  Denies having suicidal thoughts intent or plan.  Denies any homicidal thoughts.  Denies AH, VH. Patient verbalized a safety plan during the interview to interviewers.  Patient will be returning to his father's house, where  he lives.  Patient to follow up at [facility].  Medication to be sent to [facility] pharmacy.   Musculoskeletal: Strength & Muscle Tone: within normal limits Gait & Station: normal Patient leans: N/A  Psychiatric Specialty Exam  Presentation  General Appearance: Appropriate for Environment; Casual; Fairly Groomed  Eye Contact:Good  Speech:Normal Rate  Speech Volume:Normal  Handedness: No data recorded  Mood and Affect  Mood:Euthymic  Duration of Depression Symptoms: Greater than two weeks  Affect:Appropriate; Congruent   Thought Process  Thought Processes:Coherent; Linear  Descriptions of Associations:Intact  Orientation:Full (Time, Place and Person)  Thought Content:Logical  History of Schizophrenia/Schizoaffective disorder:No  Duration of Psychotic Symptoms:No data recorded Hallucinations:Hallucinations: None  Ideas of Reference:None  Suicidal Thoughts:Suicidal Thoughts: No  Homicidal Thoughts:Homicidal Thoughts: No   Sensorium  Memory:Immediate Good; Recent Good; Remote Good  Judgment:Fair  Insight:Fair   Executive Functions  Concentration:Good  Attention Span:Good  Recall:Good  Fund of Knowledge:Good  Language:Good   Psychomotor Activity  Psychomotor Activity:Psychomotor Activity: Normal   Assets  Assets:Communication Skills; Desire for Improvement; Physical Health; Resilience; Social Support   Sleep  Sleep:Sleep: Good Number of Hours of Sleep: 6.5   Physical Exam: Physical Exam Constitutional:      Appearance: Normal appearance.  HENT:     Nose: No congestion or rhinorrhea.  Pulmonary:     Effort: No respiratory distress.  Neurological:     Mental Status: He is alert.     Cranial Nerves: No cranial nerve deficit.     Motor: No weakness.     Gait: Gait normal.  Psychiatric:        Mood and Affect: Mood normal.        Behavior: Behavior normal.        Thought Content: Thought content normal.  Judgment: Judgment normal.    Review of Systems  Constitutional:  Negative for chills and fever.  HENT:  Negative for hearing loss.   Eyes:  Negative for blurred vision and double vision.  Cardiovascular:  Negative for chest pain and palpitations.  Gastrointestinal:  Negative for abdominal pain, constipation, diarrhea, nausea and vomiting.  Psychiatric/Behavioral:  Negative for depression, hallucinations, memory loss, substance abuse and suicidal ideas. The patient is not nervous/anxious and does not have insomnia.   All other systems reviewed and are negative. Blood pressure 133/84, pulse 94, temperature (!) 97.4 F (36.3 C), temperature source Oral, resp. rate 18, height 5\' 9"  (1.753 m), weight 82.1 kg, SpO2 99 %. Body mass index is 26.73 kg/m.  Mental Status Per Nursing Assessment::   On Admission:  Self-harm thoughts  Demographic Factors:  Male, Adolescent or young adult, Divorced or widowed, Caucasian, and veteran  Loss Factors: Loss of significant relationship and Financial problems/change in socioeconomic status  Historical Factors: Family history of suicide, Family history of mental illness or substance abuse, Anniversary of important loss, and Impulsivity  Risk Reduction Factors:   Sense of responsibility to family, Religious beliefs about death, Living with another person, especially a relative, Positive social support, Positive therapeutic relationship, and Positive coping skills or problem solving skills  Continued Clinical Symptoms:  Depression:   Impulsivity More than one psychiatric diagnosis Previous Psychiatric Diagnoses and Treatments  Cognitive Features That Contribute To Risk:  None    Suicide Risk:  Mild:   Patient has consistently been denying having suicidal thoughts for 48 hours.  There are no identifiable suicide plans, no associated intent, mild dysphoria and related symptoms, good self-control (both objective and subjective assessment), and identifiable protective factors,  including available and accessible social support.   Follow-up Information     Clinic, [facility] Go on 12/20/2020.   Why: 2,3  You have a hospital discharge appointment for therapy and medication management services on 12/20/20 at 9:30 am.  This appointment will be held in person. Contact information: [address] [facility] [place] [phone]                 Plan Of Care/Follow-up recommendations:  Activity: As tolerated Diet: Heart healthy Other:  I recommend remaining sober.  I recommend adherence to psychiatric medications.  Follow up with outpatient psychiatric provider.  Follow up with PCP about elevated lipids. Follow-up with PCP about blood pressure management.  Make sure to sit on the side of the bed before standing up, during the night, to avoid dizziness or falls.   [phone], MD 12/10/2020, 8:17 AM

## 2020-12-10 NOTE — Group Note (Signed)
Recreation Therapy Group Note   Group Topic:Coping Skills  Group Date: 12/10/2020 Start Time: 1000 End Time: 1035 Facilitators: Caroll Rancher, LRT/CTRS Location: [address] Dayroom  Goal Area(s) Addresses:  Patient will identify positive coping skills. Patient will identify benefit of using positive coping skills post d/c.  Group Description:  Mind Map.  Patients were given a blank diagram of a mind map.  LRT and patients identified 8 instances in which positive coping skills can be used (depression, trauma, anger, anxiety, stress, tragedy, drama and sickness).  Patients were then given time to come up with at least 3 coping skills for each instance identified.  LRT would then fill in the coping skills on the board giving any one with blank boxes on their chart to fill them in coping skills they did not have.     Affect/Mood: N/A   Participation Level: Did not attend    Clinical Observations/Individualized Feedback: Pt did not attend group.   Plan: Continue to engage patient in RT group sessions 2-3x/week.   Caroll Rancher, LRT/CTRS 12/10/2020 11:57 AM

## 2020-12-10 NOTE — Plan of Care (Signed)
°  Problem: Education: °Goal: Emotional status will improve °Outcome: Progressing °Goal: Mental status will improve °Outcome: Progressing °Goal: Verbalization of understanding the information provided will improve °Outcome: Progressing °  °

## 2020-12-10 NOTE — Progress Notes (Signed)
Pt discharged to lobby. Pt was stable and appreciative at that time. All papers and prescriptions were given and valuables returned. Verbal understanding expressed. Denies SI/HI and A/VH. Pt given opportunity to express concerns and ask questions.  

## 2020-12-10 NOTE — Progress Notes (Signed)
  [facility] Adult Case Management Discharge Plan :  Will you be returning to the same living situation after discharge:  Yes,  Father's Home  At discharge, do you have transportation home?: Yes,  Father  Do you have the ability to pay for your medications: Yes,  Veteran's Benefits   Release of information consent forms completed and in the chart;  Patient's signature needed at discharge.  Patient to Follow up at:  Follow-up Information     Clinic, [facility] Go on 12/20/2020.   Why: You have a hospital discharge appointment for therapy and medication management services on 12/20/20 at 9:30 am.  This appointment will be held in person. Contact information: [address] [facility] [place] [address] [phone]                 Next level of care provider has access to [facility] Link:no  Safety Planning and Suicide Prevention discussed: Yes,  with father and patient      Has patient been referred to the Quitline?: N/A patient is not a smoker  Patient has been referred for addiction treatment: N/A  Aram Beecham, LCSWA 12/10/2020, 9:52 AM

## 2020-12-10 NOTE — Progress Notes (Signed)
  D:  Pt presents with high anxiety.  Pt states, "anxious to go home, want to see my dog."  Pt denies SI/HI, and verbally contracts for safety.  Pt denies AVH.  A:  Medication administered.  Vital signs monitored.  R:  Pt remains safe on unit with Q 15 minute safety checks.    12/09/20 2209  Psych Admission Type (Psych Patients Only)  Admission Status Involuntary  Psychosocial Assessment  Patient Complaints Anxiety  Eye Contact Fair  Facial Expression Anxious  Affect Appropriate to circumstance  Speech Logical/coherent  Interaction Assertive  Motor Activity Other (Comment) (WDL)  Appearance/Hygiene Layered clothes;Improved  Behavior Characteristics Cooperative  Mood Pleasant;Anxious  Thought Process  Coherency WDL  Content WDL  Delusions None reported or observed  Perception WDL  Hallucination None reported or observed  Judgment Poor  Confusion None  Danger to Self  Current suicidal ideation? Denies  Danger to Others  Danger to Others None reported or observed

## 2021-02-16 IMAGING — DX CHEST - 2 VIEW
2 series · 2 of 2 positions shown · non-contrast
Comparison: 07/17/2018

CLINICAL DATA: Pneumonia.

EXAM:
CHEST - 2 VIEW

[chest pa]
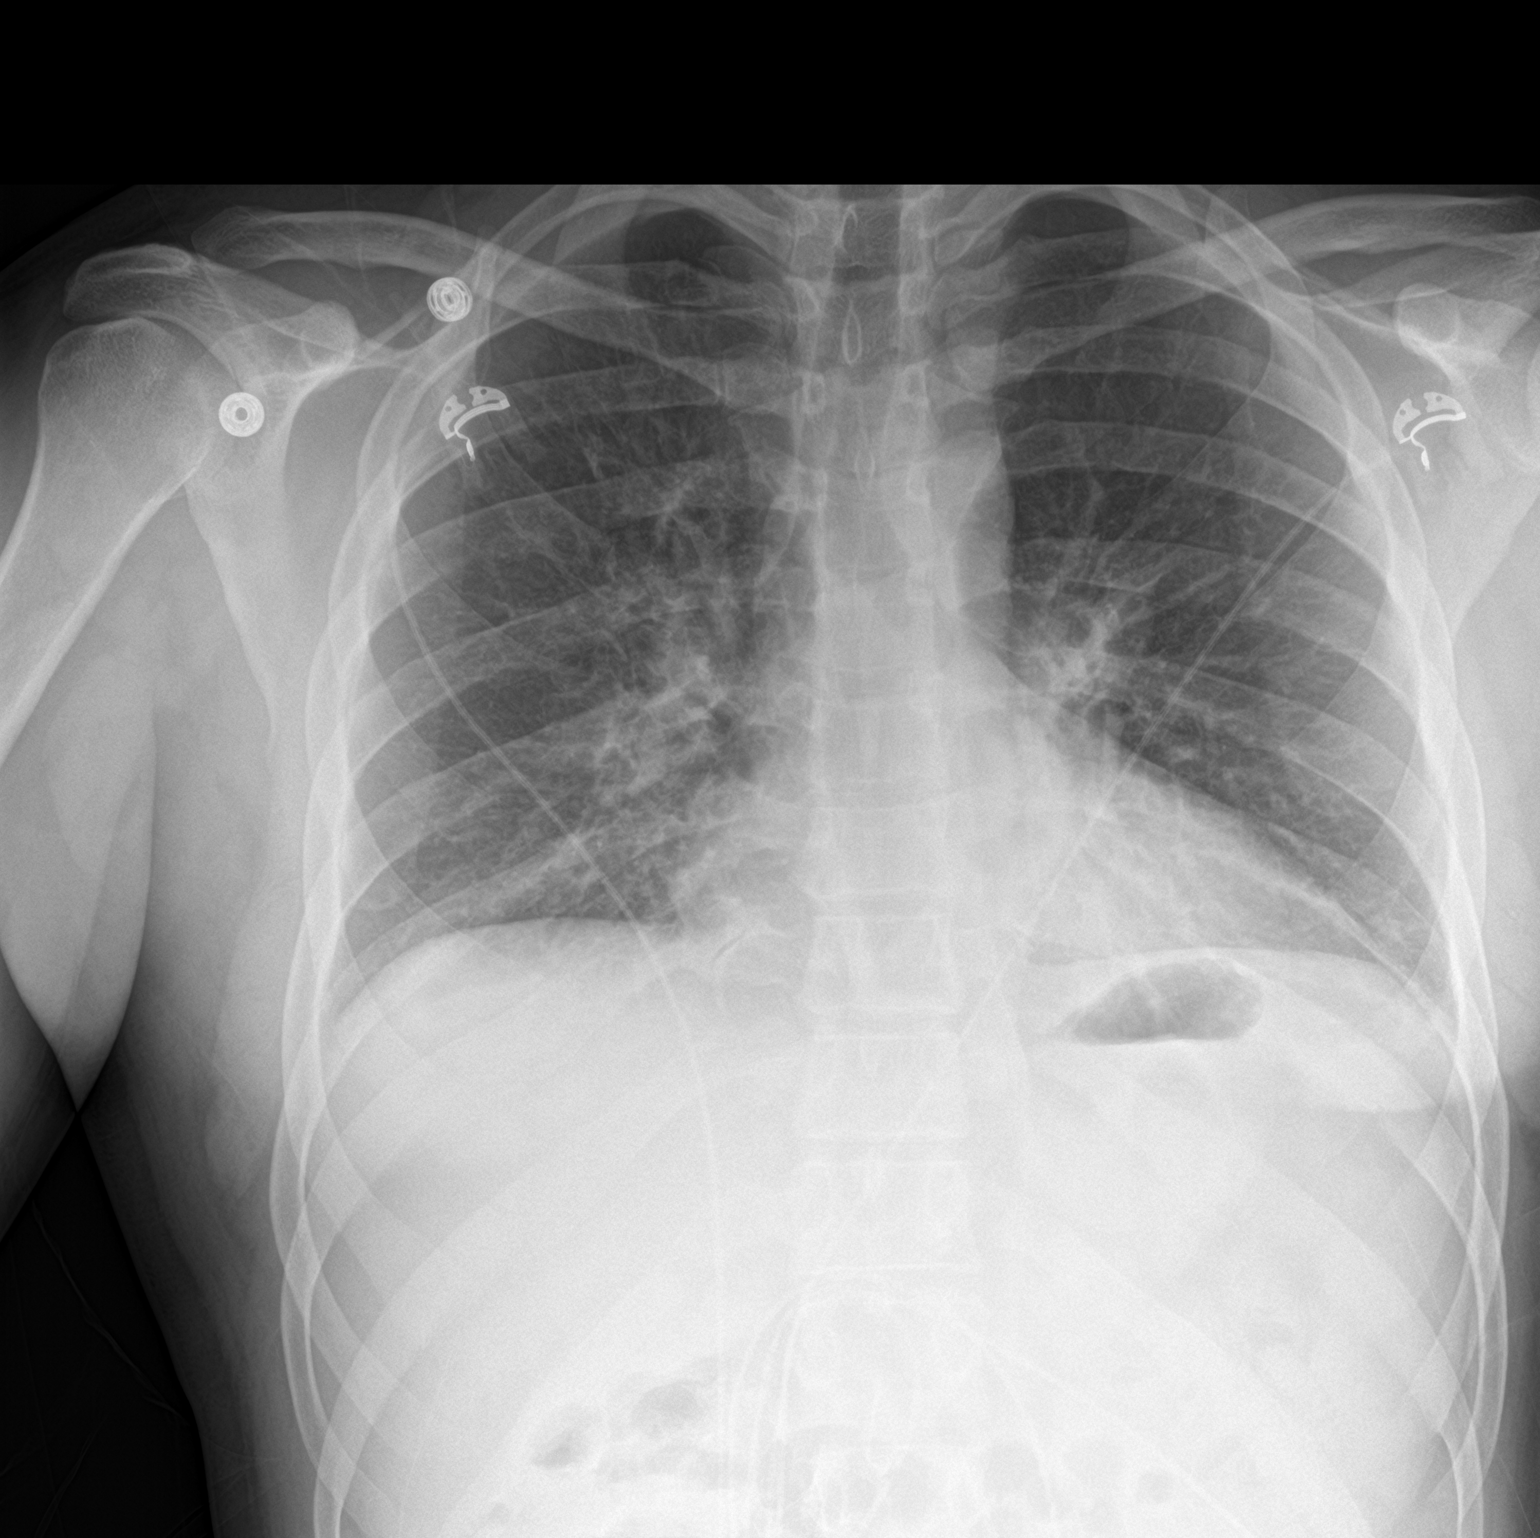

[chest lat]
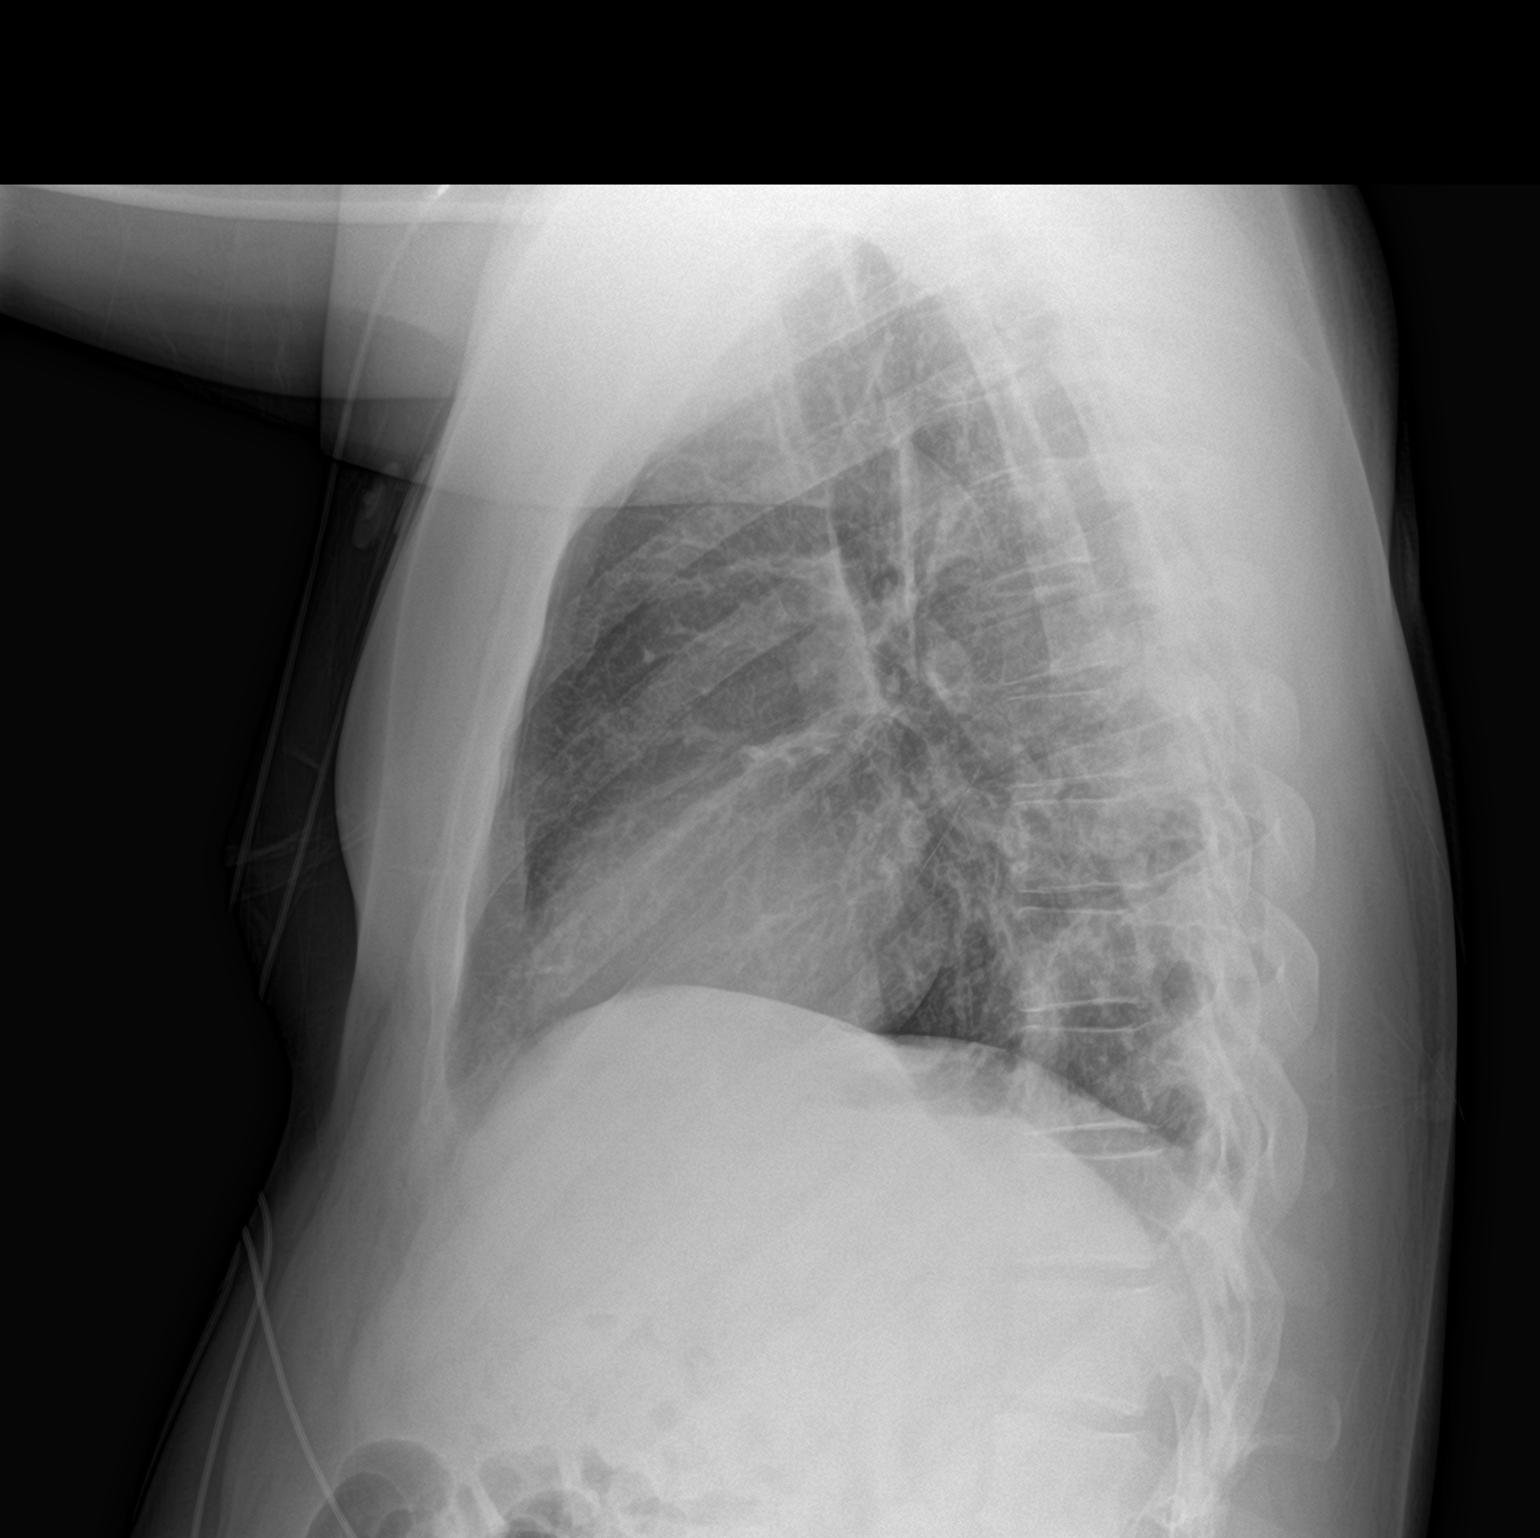

[2 of 2 positions shown; findings below may reference images not displayed]

FINDINGS: Lungs are adequately inflated with interval improving airspace
process over the central right mid to upper lung. Improving airspace
density over the posterior lower lungs on the lateral film. No
effusion. Cardiomediastinal silhouette and remainder of the exam is
unchanged.
IMPRESSION: Improving multifocal airspace process likely improving pneumonia.
